# Patient Record
Sex: Male | Born: 1950
Health system: Southern US, Community
[De-identification: ages and names within clinical notes are randomized; demographics above are authoritative.]

## PROBLEM LIST (undated history)

## (undated) DIAGNOSIS — S52501A Unspecified fracture of the lower end of right radius, initial encounter for closed fracture: Secondary | ICD-10-CM

## (undated) DIAGNOSIS — E7800 Pure hypercholesterolemia, unspecified: Secondary | ICD-10-CM

## (undated) DIAGNOSIS — E119 Type 2 diabetes mellitus without complications: Secondary | ICD-10-CM

## (undated) DIAGNOSIS — I10 Essential (primary) hypertension: Secondary | ICD-10-CM

## (undated) DIAGNOSIS — K219 Gastro-esophageal reflux disease without esophagitis: Secondary | ICD-10-CM

## (undated) HISTORY — PX: HERNIA REPAIR: SHX51

## (undated) HISTORY — PX: KNEE SURGERY: SHX244

## (undated) HISTORY — PX: KNEE ARTHROSCOPY: SHX127

---

## 2012-06-03 ENCOUNTER — Other Ambulatory Visit: Payer: Self-pay | Admitting: Gastroenterology

## 2012-06-03 DIAGNOSIS — R1031 Right lower quadrant pain: Secondary | ICD-10-CM

## 2012-06-08 ENCOUNTER — Ambulatory Visit
Admission: RE | Admit: 2012-06-08 | Discharge: 2012-06-08 | Disposition: A | Discharge status: Home / Self Care | Payer: Federal, State, Local not specified - PPO | Source: Ambulatory Visit | Attending: Gastroenterology | Admitting: Gastroenterology

## 2012-06-08 DIAGNOSIS — R1031 Right lower quadrant pain: Secondary | ICD-10-CM

## 2012-06-08 MED ORDER — IOHEXOL 300 MG/ML  SOLN
100.0000 mL | Freq: Once | INTRAMUSCULAR | Status: AC | PRN
  Administered 2012-06-08: 100 mL via INTRAVENOUS

## 2014-02-05 DIAGNOSIS — E785 Hyperlipidemia, unspecified: Secondary | ICD-10-CM | POA: Diagnosis not present

## 2014-02-05 DIAGNOSIS — Z0000 Encounter for general adult medical examination without abnormal findings: Secondary | ICD-10-CM | POA: Diagnosis not present

## 2014-02-05 DIAGNOSIS — E119 Type 2 diabetes mellitus without complications: Secondary | ICD-10-CM | POA: Diagnosis not present

## 2014-02-05 DIAGNOSIS — K219 Gastro-esophageal reflux disease without esophagitis: Secondary | ICD-10-CM | POA: Diagnosis not present

## 2014-02-05 DIAGNOSIS — I10 Essential (primary) hypertension: Secondary | ICD-10-CM | POA: Diagnosis not present

## 2014-03-21 DIAGNOSIS — L570 Actinic keratosis: Secondary | ICD-10-CM | POA: Diagnosis not present

## 2014-03-21 DIAGNOSIS — L821 Other seborrheic keratosis: Secondary | ICD-10-CM | POA: Diagnosis not present

## 2014-06-14 DIAGNOSIS — Z23 Encounter for immunization: Secondary | ICD-10-CM | POA: Diagnosis not present

## 2014-09-21 DIAGNOSIS — L738 Other specified follicular disorders: Secondary | ICD-10-CM | POA: Diagnosis not present

## 2014-09-21 DIAGNOSIS — E785 Hyperlipidemia, unspecified: Secondary | ICD-10-CM | POA: Diagnosis not present

## 2014-09-21 DIAGNOSIS — L570 Actinic keratosis: Secondary | ICD-10-CM | POA: Diagnosis not present

## 2014-09-21 DIAGNOSIS — Z23 Encounter for immunization: Secondary | ICD-10-CM | POA: Diagnosis not present

## 2014-09-21 DIAGNOSIS — L821 Other seborrheic keratosis: Secondary | ICD-10-CM | POA: Diagnosis not present

## 2014-09-21 DIAGNOSIS — K219 Gastro-esophageal reflux disease without esophagitis: Secondary | ICD-10-CM | POA: Diagnosis not present

## 2014-09-21 DIAGNOSIS — I10 Essential (primary) hypertension: Secondary | ICD-10-CM | POA: Diagnosis not present

## 2014-09-21 DIAGNOSIS — Z86010 Personal history of colonic polyps: Secondary | ICD-10-CM | POA: Diagnosis not present

## 2014-09-21 DIAGNOSIS — E119 Type 2 diabetes mellitus without complications: Secondary | ICD-10-CM | POA: Diagnosis not present

## 2014-09-21 DIAGNOSIS — F419 Anxiety disorder, unspecified: Secondary | ICD-10-CM | POA: Diagnosis not present

## 2014-11-12 DIAGNOSIS — E119 Type 2 diabetes mellitus without complications: Secondary | ICD-10-CM | POA: Diagnosis not present

## 2015-04-03 DIAGNOSIS — Z0000 Encounter for general adult medical examination without abnormal findings: Secondary | ICD-10-CM | POA: Diagnosis not present

## 2015-04-03 DIAGNOSIS — K219 Gastro-esophageal reflux disease without esophagitis: Secondary | ICD-10-CM | POA: Diagnosis not present

## 2015-04-03 DIAGNOSIS — E785 Hyperlipidemia, unspecified: Secondary | ICD-10-CM | POA: Diagnosis not present

## 2015-04-03 DIAGNOSIS — E119 Type 2 diabetes mellitus without complications: Secondary | ICD-10-CM | POA: Diagnosis not present

## 2015-04-03 DIAGNOSIS — Z23 Encounter for immunization: Secondary | ICD-10-CM | POA: Diagnosis not present

## 2015-04-03 DIAGNOSIS — I10 Essential (primary) hypertension: Secondary | ICD-10-CM | POA: Diagnosis not present

## 2015-04-03 DIAGNOSIS — Z125 Encounter for screening for malignant neoplasm of prostate: Secondary | ICD-10-CM | POA: Diagnosis not present

## 2015-09-20 DIAGNOSIS — D1801 Hemangioma of skin and subcutaneous tissue: Secondary | ICD-10-CM | POA: Diagnosis not present

## 2015-09-20 DIAGNOSIS — L570 Actinic keratosis: Secondary | ICD-10-CM | POA: Diagnosis not present

## 2015-09-20 DIAGNOSIS — L814 Other melanin hyperpigmentation: Secondary | ICD-10-CM | POA: Diagnosis not present

## 2015-09-20 DIAGNOSIS — L821 Other seborrheic keratosis: Secondary | ICD-10-CM | POA: Diagnosis not present

## 2015-09-20 DIAGNOSIS — D225 Melanocytic nevi of trunk: Secondary | ICD-10-CM | POA: Diagnosis not present

## 2015-10-02 DIAGNOSIS — K219 Gastro-esophageal reflux disease without esophagitis: Secondary | ICD-10-CM | POA: Diagnosis not present

## 2015-10-02 DIAGNOSIS — I10 Essential (primary) hypertension: Secondary | ICD-10-CM | POA: Diagnosis not present

## 2015-10-02 DIAGNOSIS — E785 Hyperlipidemia, unspecified: Secondary | ICD-10-CM | POA: Diagnosis not present

## 2015-10-02 DIAGNOSIS — E119 Type 2 diabetes mellitus without complications: Secondary | ICD-10-CM | POA: Diagnosis not present

## 2015-10-02 DIAGNOSIS — Z7984 Long term (current) use of oral hypoglycemic drugs: Secondary | ICD-10-CM | POA: Diagnosis not present

## 2015-11-14 DIAGNOSIS — E119 Type 2 diabetes mellitus without complications: Secondary | ICD-10-CM | POA: Diagnosis not present

## 2015-11-14 DIAGNOSIS — Z0100 Encounter for examination of eyes and vision without abnormal findings: Secondary | ICD-10-CM | POA: Diagnosis not present

## 2015-11-14 DIAGNOSIS — H2513 Age-related nuclear cataract, bilateral: Secondary | ICD-10-CM | POA: Diagnosis not present

## 2016-01-17 DIAGNOSIS — E1142 Type 2 diabetes mellitus with diabetic polyneuropathy: Secondary | ICD-10-CM | POA: Diagnosis not present

## 2016-01-17 DIAGNOSIS — Z7984 Long term (current) use of oral hypoglycemic drugs: Secondary | ICD-10-CM | POA: Diagnosis not present

## 2016-04-13 DIAGNOSIS — Z23 Encounter for immunization: Secondary | ICD-10-CM | POA: Diagnosis not present

## 2016-04-13 DIAGNOSIS — E1142 Type 2 diabetes mellitus with diabetic polyneuropathy: Secondary | ICD-10-CM | POA: Diagnosis not present

## 2016-04-13 DIAGNOSIS — E785 Hyperlipidemia, unspecified: Secondary | ICD-10-CM | POA: Diagnosis not present

## 2016-04-13 DIAGNOSIS — Z125 Encounter for screening for malignant neoplasm of prostate: Secondary | ICD-10-CM | POA: Diagnosis not present

## 2016-04-13 DIAGNOSIS — I10 Essential (primary) hypertension: Secondary | ICD-10-CM | POA: Diagnosis not present

## 2016-04-13 DIAGNOSIS — K219 Gastro-esophageal reflux disease without esophagitis: Secondary | ICD-10-CM | POA: Diagnosis not present

## 2016-04-13 DIAGNOSIS — Z0000 Encounter for general adult medical examination without abnormal findings: Secondary | ICD-10-CM | POA: Diagnosis not present

## 2016-09-23 DIAGNOSIS — D1800 Hemangioma unspecified site: Secondary | ICD-10-CM | POA: Diagnosis not present

## 2016-09-23 DIAGNOSIS — L821 Other seborrheic keratosis: Secondary | ICD-10-CM | POA: Diagnosis not present

## 2016-09-23 DIAGNOSIS — Z23 Encounter for immunization: Secondary | ICD-10-CM | POA: Diagnosis not present

## 2016-09-23 DIAGNOSIS — L814 Other melanin hyperpigmentation: Secondary | ICD-10-CM | POA: Diagnosis not present

## 2016-09-23 DIAGNOSIS — D225 Melanocytic nevi of trunk: Secondary | ICD-10-CM | POA: Diagnosis not present

## 2016-10-15 DIAGNOSIS — E7800 Pure hypercholesterolemia, unspecified: Secondary | ICD-10-CM | POA: Diagnosis not present

## 2016-10-15 DIAGNOSIS — Z7984 Long term (current) use of oral hypoglycemic drugs: Secondary | ICD-10-CM | POA: Diagnosis not present

## 2016-10-15 DIAGNOSIS — E1142 Type 2 diabetes mellitus with diabetic polyneuropathy: Secondary | ICD-10-CM | POA: Diagnosis not present

## 2016-10-15 DIAGNOSIS — I10 Essential (primary) hypertension: Secondary | ICD-10-CM | POA: Diagnosis not present

## 2016-10-15 DIAGNOSIS — K219 Gastro-esophageal reflux disease without esophagitis: Secondary | ICD-10-CM | POA: Diagnosis not present

## 2016-11-13 DIAGNOSIS — H2513 Age-related nuclear cataract, bilateral: Secondary | ICD-10-CM | POA: Diagnosis not present

## 2016-11-13 DIAGNOSIS — H5203 Hypermetropia, bilateral: Secondary | ICD-10-CM | POA: Diagnosis not present

## 2016-11-13 DIAGNOSIS — E119 Type 2 diabetes mellitus without complications: Secondary | ICD-10-CM | POA: Diagnosis not present

## 2017-04-23 DIAGNOSIS — Z1389 Encounter for screening for other disorder: Secondary | ICD-10-CM | POA: Diagnosis not present

## 2017-04-23 DIAGNOSIS — I10 Essential (primary) hypertension: Secondary | ICD-10-CM | POA: Diagnosis not present

## 2017-04-23 DIAGNOSIS — Z125 Encounter for screening for malignant neoplasm of prostate: Secondary | ICD-10-CM | POA: Diagnosis not present

## 2017-04-23 DIAGNOSIS — Z23 Encounter for immunization: Secondary | ICD-10-CM | POA: Diagnosis not present

## 2017-04-23 DIAGNOSIS — E1142 Type 2 diabetes mellitus with diabetic polyneuropathy: Secondary | ICD-10-CM | POA: Diagnosis not present

## 2017-04-23 DIAGNOSIS — E7800 Pure hypercholesterolemia, unspecified: Secondary | ICD-10-CM | POA: Diagnosis not present

## 2017-04-23 DIAGNOSIS — K219 Gastro-esophageal reflux disease without esophagitis: Secondary | ICD-10-CM | POA: Diagnosis not present

## 2017-07-15 ENCOUNTER — Emergency Department (HOSPITAL_COMMUNITY)
Admission: EM | Admit: 2017-07-15 | Discharge: 2017-07-15 | Disposition: A | Discharge status: Home / Self Care | Payer: Medicare Other | Attending: Emergency Medicine | Admitting: Emergency Medicine

## 2017-07-15 ENCOUNTER — Encounter (HOSPITAL_COMMUNITY): Payer: Self-pay | Admitting: Emergency Medicine

## 2017-07-15 ENCOUNTER — Emergency Department (HOSPITAL_COMMUNITY): Payer: Medicare Other

## 2017-07-15 DIAGNOSIS — Y999 Unspecified external cause status: Secondary | ICD-10-CM | POA: Insufficient documentation

## 2017-07-15 DIAGNOSIS — W000XXA Fall on same level due to ice and snow, initial encounter: Secondary | ICD-10-CM | POA: Insufficient documentation

## 2017-07-15 DIAGNOSIS — E119 Type 2 diabetes mellitus without complications: Secondary | ICD-10-CM | POA: Insufficient documentation

## 2017-07-15 DIAGNOSIS — S52571A Other intraarticular fracture of lower end of right radius, initial encounter for closed fracture: Secondary | ICD-10-CM | POA: Diagnosis not present

## 2017-07-15 DIAGNOSIS — Z87891 Personal history of nicotine dependence: Secondary | ICD-10-CM | POA: Insufficient documentation

## 2017-07-15 DIAGNOSIS — Y929 Unspecified place or not applicable: Secondary | ICD-10-CM | POA: Diagnosis not present

## 2017-07-15 DIAGNOSIS — S52501A Unspecified fracture of the lower end of right radius, initial encounter for closed fracture: Secondary | ICD-10-CM

## 2017-07-15 DIAGNOSIS — Z791 Long term (current) use of non-steroidal anti-inflammatories (NSAID): Secondary | ICD-10-CM | POA: Diagnosis not present

## 2017-07-15 DIAGNOSIS — S59911A Unspecified injury of right forearm, initial encounter: Secondary | ICD-10-CM | POA: Diagnosis present

## 2017-07-15 DIAGNOSIS — I10 Essential (primary) hypertension: Secondary | ICD-10-CM | POA: Diagnosis not present

## 2017-07-15 DIAGNOSIS — Y9389 Activity, other specified: Secondary | ICD-10-CM | POA: Diagnosis not present

## 2017-07-15 HISTORY — DX: Pure hypercholesterolemia, unspecified: E78.00

## 2017-07-15 HISTORY — DX: Type 2 diabetes mellitus without complications: E11.9

## 2017-07-15 HISTORY — DX: Gastro-esophageal reflux disease without esophagitis: K21.9

## 2017-07-15 HISTORY — DX: Essential (primary) hypertension: I10

## 2017-07-15 MED ORDER — HYDROCODONE-ACETAMINOPHEN 5-325 MG PO TABS: 1.0000 | ORAL_TABLET | Freq: Four times a day (QID) | ORAL | 0 refills | Status: DC | PRN

## 2017-07-15 MED ORDER — HYDROCODONE-ACETAMINOPHEN 5-325 MG PO TABS
1.0000 | ORAL_TABLET | Freq: Once | ORAL | Status: AC
  Administered 2017-07-15: 1 via ORAL
  Filled 2017-07-15: qty 1

## 2017-07-15 NOTE — ED Triage Notes (Signed)
Patient reports slipping on ice this morning causing him to fall. Patient c/o pain in right wrist area. Swelling is noted at this time.

## 2017-07-15 NOTE — ED Provider Notes (Signed)
[facility] DEPT Provider Note   CSN: [id number] Arrival date & time: 07/15/17  0849     History   Chief Complaint Chief Complaint  Patient presents with  . Fall  . Wrist Pain    HPI Bradley Larsen is a 66 y.o. male w PMHx HTN, DM, presenting s/p mechanical fall that occurred prior to arrival. Pt slipped on the ice, catching himself on his outstretched right hand. Localizes pain to dorsal lateral aspect of wrist, worse with movement and palpation. Notes assoc swelling. Denies N/T. He is right hand dominant, not a smoker. Hx of right wrist fracture at age 16. No other injuries, denies head trauma, no neck or back pain. No wounds.  The history is provided by the patient.    Past Medical History:  Diagnosis Date  . Acid reflux   . Diabetes mellitus without complication ([facility])   . High cholesterol   . Hypertension     There are no active problems to display for this patient.   Past Surgical History:  Procedure Laterality Date  . HERNIA REPAIR         Home Medications    Prior to Admission medications   Medication Sig Start Date End Date Taking? Authorizing Provider  atorvastatin (LIPITOR) 40 MG tablet Take 40 mg by mouth daily. 07/06/17  Yes [provider]  gabapentin (NEURONTIN) 300 MG capsule Take 300 mg by mouth 3 (three) times daily. 07/06/17  Yes [provider]  ibuprofen (ADVIL,MOTRIN) 200 MG tablet Take 400-600 mg by mouth every 6 (six) hours as needed for headache, mild pain or moderate pain.   Yes [provider]  lansoprazole (PREVACID) 30 MG capsule Take 30 mg by mouth daily. 07/08/17  Yes [provider]  lisinopril-hydrochlorothiazide (PRINZIDE,ZESTORETIC) 20-25 MG tablet Take 1 tablet by mouth daily. 07/06/17  Yes [provider]  metFORMIN (GLUCOPHAGE) 500 MG tablet Take 500 mg by mouth 2 (two) times daily. 07/06/17  Yes [provider]  HYDROcodone-acetaminophen  (NORCO/VICODIN) 5-325 MG tablet Take 1-2 tablets by mouth every 6 (six) hours as needed for severe pain. 07/15/17   Eldredge Veldhuizen, [place] N, PA-C    Family History No family history on file.  Social History Social History   Tobacco Use  . Smoking status: Former Research scientist (life sciences)  . Smokeless tobacco: Never Used  Substance Use Topics  . Alcohol use: Yes  . Drug use: Not on file     Allergies   Patient has no known allergies.   Review of Systems Review of Systems  Musculoskeletal: Positive for arthralgias and joint swelling.  Skin: Negative for wound.  Neurological: Negative for numbness.  All other systems reviewed and are negative.    Physical Exam Updated Vital Signs BP (!) 152/86 (BP Location: Left Arm)   Pulse 64   Temp 97.9 F (36.6 C) (Oral)   Resp 16   Ht 6' (1.829 m)   Wt 90.7 kg (200 lb)   SpO2 100%   BMI 27.12 kg/m   Physical Exam  Constitutional: He appears well-developed and well-nourished. No distress.  HENT:  Head: Normocephalic and atraumatic.  Eyes: Conjunctivae are normal.  Cardiovascular: Normal rate and intact distal pulses.  Pulmonary/Chest: Effort normal.  Abdominal: Soft.  Musculoskeletal:  Right dorsal-lateral aspect of wrist with edema and tenderness.  Some tenderness to distal right forearm.  No tenderness over ulnar styloid process.  Elbow and shoulder without tenderness and with normal range of motion.  Fingers and hand with normal  range of motion and without tenderness.  Intact distal pulses and normal sensation.  No wounds.  Neurological: He is alert.  Skin: Skin is warm.  Psychiatric: He has a normal mood and affect. His behavior is normal.  Nursing note and vitals reviewed.    ED Treatments / Results  Labs (all labs ordered are listed, but only abnormal results are displayed) Labs Reviewed - No data to display  EKG  EKG Interpretation None       Radiology Dg Wrist Complete Right  Result Date: 07/15/2017 CLINICAL DATA:  Fall  today with wrist pain, initial encounter EXAM: RIGHT WRIST - COMPLETE 3+ VIEW COMPARISON:  None. FINDINGS: There is a comminuted fracture of the distal right radius with extension into the radiocarpal joint. Mild impaction and posterior angulation at the fracture site is noted. Degenerative changes in the second carpal row are noted. No other fractures are identified. IMPRESSION: Comminuted distal right radial fracture with intra-articular involvement. Electronically Signed   By: Inez Catalina M.D.   On: 07/15/2017 09:35    Procedures Procedures (including critical care time)  Medications Ordered in ED Medications  HYDROcodone-acetaminophen (NORCO/VICODIN) 5-325 MG per tablet 1 tablet (1 tablet Oral Given 07/15/17 0951)     Initial Impression / Assessment and Plan / ED Course  I have reviewed the triage vital signs and the nursing notes.  Pertinent labs & imaging results that were available during my care of the patient were reviewed by me and considered in my medical decision making (see chart for details).  Clinical Course as of Jul 15 1020  Thu Jul 15, 2017  1008 Joslyn Devon, RN with Dr. Fredna Dow, reporting Dr. Fredna Dow recommends splint and outpatient follow up.  [JR]    Clinical Course User Index [JR] Dulcey Riederer, [place] N, PA-C    Patient X-Ray with right comminuted fracture of distal radius with intra-articular involvement. NV intact. Pain managed in ED. Dr. Fredna Dow recommending splint and outpatient follow up. Arm placed in sugar tong splint in ED. Conservative therapy recommended and discussed.   Patient discussed with and seen by Dr. Lacinda Axon.  [place] Cont[place] Substance reporting System queried  Discussed results, findings, treatment and follow up. Patient advised of return precautions. Patient verbalized understanding and agreed with plan.  Final Clinical Impressions(s) / ED Diagnoses   Final diagnoses:  Nondisplaced fracture of distal end of right radius    ED Discharge Orders         Ordered    HYDROcodone-acetaminophen (NORCO/VICODIN) 5-325 MG tablet  Every 6 hours PRN     07/15/17 1021       Tekela Garguilo, [place] N, PA-C 07/15/17 1021    Tim Wilhide, [place] N, [place] 07/15/17 1021    Nat Christen, MD 07/16/17 1149

## 2017-07-15 NOTE — Discharge Instructions (Signed)
Please read instructions below. Keep your wrist elevated as much as possible. You can take hydrocodone every 6 hours as needed for severe pain. Do not drive, drink alcohol, or take tylenol with this medication. You can take advil/ibuprofen every 6 hours with this medication, or alone for moderate pain. Schedule an appointment with the orthopedic specialist in 1 week for repeat x-ray and follow-up on your injury. Return to the ER for new or concerning symptoms.

## 2017-07-19 ENCOUNTER — Other Ambulatory Visit: Payer: Self-pay | Admitting: Orthopedic Surgery

## 2017-07-19 DIAGNOSIS — S52571A Other intraarticular fracture of lower end of right radius, initial encounter for closed fracture: Secondary | ICD-10-CM | POA: Diagnosis not present

## 2017-07-20 ENCOUNTER — Encounter (HOSPITAL_BASED_OUTPATIENT_CLINIC_OR_DEPARTMENT_OTHER): Payer: Self-pay | Admitting: *Deleted

## 2017-07-20 ENCOUNTER — Other Ambulatory Visit: Payer: Self-pay

## 2017-07-20 ENCOUNTER — Encounter (HOSPITAL_BASED_OUTPATIENT_CLINIC_OR_DEPARTMENT_OTHER)
Admission: RE | Admit: 2017-07-20 | Discharge: 2017-07-20 | Disposition: A | Discharge status: Home / Self Care | Payer: Medicare Other | Source: Ambulatory Visit | Attending: Orthopedic Surgery | Admitting: Orthopedic Surgery

## 2017-07-20 DIAGNOSIS — K219 Gastro-esophageal reflux disease without esophagitis: Secondary | ICD-10-CM | POA: Diagnosis not present

## 2017-07-20 DIAGNOSIS — E119 Type 2 diabetes mellitus without complications: Secondary | ICD-10-CM | POA: Diagnosis not present

## 2017-07-20 DIAGNOSIS — Z7984 Long term (current) use of oral hypoglycemic drugs: Secondary | ICD-10-CM | POA: Diagnosis not present

## 2017-07-20 DIAGNOSIS — Z79899 Other long term (current) drug therapy: Secondary | ICD-10-CM | POA: Diagnosis not present

## 2017-07-20 DIAGNOSIS — S52571A Other intraarticular fracture of lower end of right radius, initial encounter for closed fracture: Secondary | ICD-10-CM | POA: Diagnosis not present

## 2017-07-20 DIAGNOSIS — I10 Essential (primary) hypertension: Secondary | ICD-10-CM | POA: Diagnosis not present

## 2017-07-20 DIAGNOSIS — W000XXA Fall on same level due to ice and snow, initial encounter: Secondary | ICD-10-CM | POA: Diagnosis not present

## 2017-07-20 DIAGNOSIS — Z87891 Personal history of nicotine dependence: Secondary | ICD-10-CM | POA: Diagnosis not present

## 2017-07-20 DIAGNOSIS — E7800 Pure hypercholesterolemia, unspecified: Secondary | ICD-10-CM | POA: Diagnosis not present

## 2017-07-20 LAB — BASIC METABOLIC PANEL
ANION GAP: 9 (ref 5–15)
BUN: 19 mg/dL (ref 6–20)
CALCIUM: 9.5 mg/dL (ref 8.9–10.3)
CO2: 25 mmol/L (ref 22–32)
CREATININE: 0.9 mg/dL (ref 0.61–1.24)
Chloride: 102 mmol/L (ref 101–111)
GFR calc non Af Amer: >60 mL/min (ref >60)
Glucose, Bld: 139 mg/dL — ABNORMAL HIGH (ref 65–99)
Potassium: 4.2 mmol/L (ref 3.5–5.1)
SODIUM: 136 mmol/L (ref 135–145)

## 2017-07-22 ENCOUNTER — Encounter (HOSPITAL_BASED_OUTPATIENT_CLINIC_OR_DEPARTMENT_OTHER): Payer: Self-pay | Admitting: *Deleted

## 2017-07-22 ENCOUNTER — Encounter (HOSPITAL_BASED_OUTPATIENT_CLINIC_OR_DEPARTMENT_OTHER)
Admission: RE | Disposition: A | Discharge status: Home / Self Care | Payer: Self-pay | Source: Ambulatory Visit | Attending: Orthopedic Surgery

## 2017-07-22 ENCOUNTER — Ambulatory Visit (HOSPITAL_BASED_OUTPATIENT_CLINIC_OR_DEPARTMENT_OTHER): Payer: Medicare Other | Admitting: Anesthesiology

## 2017-07-22 ENCOUNTER — Ambulatory Visit (HOSPITAL_BASED_OUTPATIENT_CLINIC_OR_DEPARTMENT_OTHER)
Admission: RE | Admit: 2017-07-22 | Discharge: 2017-07-22 | Disposition: A | Discharge status: Home / Self Care | Payer: Medicare Other | Source: Ambulatory Visit | Attending: Orthopedic Surgery | Admitting: Orthopedic Surgery

## 2017-07-22 ENCOUNTER — Other Ambulatory Visit: Payer: Self-pay

## 2017-07-22 DIAGNOSIS — Z87891 Personal history of nicotine dependence: Secondary | ICD-10-CM | POA: Diagnosis not present

## 2017-07-22 DIAGNOSIS — E119 Type 2 diabetes mellitus without complications: Secondary | ICD-10-CM | POA: Diagnosis not present

## 2017-07-22 DIAGNOSIS — Z79899 Other long term (current) drug therapy: Secondary | ICD-10-CM | POA: Insufficient documentation

## 2017-07-22 DIAGNOSIS — I10 Essential (primary) hypertension: Secondary | ICD-10-CM | POA: Diagnosis not present

## 2017-07-22 DIAGNOSIS — E7800 Pure hypercholesterolemia, unspecified: Secondary | ICD-10-CM | POA: Insufficient documentation

## 2017-07-22 DIAGNOSIS — G8918 Other acute postprocedural pain: Secondary | ICD-10-CM | POA: Diagnosis not present

## 2017-07-22 DIAGNOSIS — W000XXA Fall on same level due to ice and snow, initial encounter: Secondary | ICD-10-CM | POA: Insufficient documentation

## 2017-07-22 DIAGNOSIS — K219 Gastro-esophageal reflux disease without esophagitis: Secondary | ICD-10-CM | POA: Insufficient documentation

## 2017-07-22 DIAGNOSIS — Z7984 Long term (current) use of oral hypoglycemic drugs: Secondary | ICD-10-CM | POA: Insufficient documentation

## 2017-07-22 DIAGNOSIS — S52571A Other intraarticular fracture of lower end of right radius, initial encounter for closed fracture: Secondary | ICD-10-CM | POA: Diagnosis not present

## 2017-07-22 DIAGNOSIS — S52501A Unspecified fracture of the lower end of right radius, initial encounter for closed fracture: Secondary | ICD-10-CM | POA: Diagnosis not present

## 2017-07-22 HISTORY — PX: OPEN REDUCTION INTERNAL FIXATION (ORIF) DISTAL RADIAL FRACTURE: SHX5989

## 2017-07-22 HISTORY — DX: Unspecified fracture of the lower end of right radius, initial encounter for closed fracture: S52.501A

## 2017-07-22 LAB — GLUCOSE, CAPILLARY
GLUCOSE-CAPILLARY: 150 mg/dL — AB (ref 65–99)
Glucose-Capillary: 136 mg/dL — ABNORMAL HIGH (ref 65–99)

## 2017-07-22 SURGERY — OPEN REDUCTION INTERNAL FIXATION (ORIF) DISTAL RADIUS FRACTURE
Anesthesia: General | Site: Hand | Laterality: Right

## 2017-07-22 MED ORDER — ONDANSETRON HCL 4 MG/2ML IJ SOLN
INTRAMUSCULAR | Status: DC | PRN
  Administered 2017-07-22: 4 mg via INTRAVENOUS

## 2017-07-22 MED ORDER — HYDROMORPHONE HCL 1 MG/ML IJ SOLN
INTRAMUSCULAR | Status: AC
  Filled 2017-07-22: qty 0.5

## 2017-07-22 MED ORDER — LACTATED RINGERS IV SOLN
INTRAVENOUS | Status: DC
  Administered 2017-07-22 (×2): via INTRAVENOUS

## 2017-07-22 MED ORDER — FENTANYL CITRATE (PF) 100 MCG/2ML IJ SOLN
INTRAMUSCULAR | Status: AC
  Filled 2017-07-22: qty 2

## 2017-07-22 MED ORDER — HYDROMORPHONE HCL 1 MG/ML IJ SOLN
0.2500 mg | INTRAMUSCULAR | Status: DC | PRN
  Administered 2017-07-22 (×3): 0.5 mg via INTRAVENOUS

## 2017-07-22 MED ORDER — KETOROLAC TROMETHAMINE 30 MG/ML IJ SOLN
INTRAMUSCULAR | Status: AC
  Filled 2017-07-22: qty 1

## 2017-07-22 MED ORDER — PROPOFOL 10 MG/ML IV BOLUS
INTRAVENOUS | Status: DC | PRN
Start: 2017-07-22 — End: 2017-07-22
  Administered 2017-07-22: 150 mg via INTRAVENOUS

## 2017-07-22 MED ORDER — DEXAMETHASONE SODIUM PHOSPHATE 4 MG/ML IJ SOLN
INTRAMUSCULAR | Status: DC | PRN
  Administered 2017-07-22: 10 mg via INTRAVENOUS

## 2017-07-22 MED ORDER — OXYCODONE HCL 5 MG PO TABS
ORAL_TABLET | ORAL | Status: AC
  Filled 2017-07-22: qty 1

## 2017-07-22 MED ORDER — SCOPOLAMINE 1 MG/3DAYS TD PT72: 1.0000 | MEDICATED_PATCH | Freq: Once | TRANSDERMAL | Status: DC | PRN

## 2017-07-22 MED ORDER — FENTANYL CITRATE (PF) 100 MCG/2ML IJ SOLN
INTRAMUSCULAR | Status: DC | PRN
  Administered 2017-07-22: 25 ug via INTRAVENOUS

## 2017-07-22 MED ORDER — METHOCARBAMOL 1000 MG/10ML IJ SOLN
INTRAMUSCULAR | Status: AC
  Filled 2017-07-22: qty 10

## 2017-07-22 MED ORDER — METHOCARBAMOL 1000 MG/10ML IJ SOLN
500.0000 mg | Freq: Once | INTRAVENOUS | Status: AC
  Administered 2017-07-22: 500 mg via INTRAVENOUS

## 2017-07-22 MED ORDER — CEFAZOLIN SODIUM-DEXTROSE 2-4 GM/100ML-% IV SOLN
INTRAVENOUS | Status: AC
  Filled 2017-07-22: qty 100

## 2017-07-22 MED ORDER — MIDAZOLAM HCL 2 MG/2ML IJ SOLN
INTRAMUSCULAR | Status: AC
  Filled 2017-07-22: qty 2

## 2017-07-22 MED ORDER — OXYCODONE HCL 5 MG PO TABS
5.0000 mg | ORAL_TABLET | Freq: Once | ORAL | Status: AC
  Administered 2017-07-22: 5 mg via ORAL

## 2017-07-22 MED ORDER — PROMETHAZINE HCL 25 MG/ML IJ SOLN: 6.2500 mg | INTRAMUSCULAR | Status: DC | PRN

## 2017-07-22 MED ORDER — CEFAZOLIN SODIUM-DEXTROSE 2-4 GM/100ML-% IV SOLN
2.0000 g | INTRAVENOUS | Status: AC
  Administered 2017-07-22: 2 g via INTRAVENOUS

## 2017-07-22 MED ORDER — ROPIVACAINE HCL 7.5 MG/ML IJ SOLN
INTRAMUSCULAR | Status: DC | PRN
  Administered 2017-07-22: 25 mL via PERINEURAL

## 2017-07-22 MED ORDER — KETOROLAC TROMETHAMINE 30 MG/ML IJ SOLN
30.0000 mg | Freq: Once | INTRAMUSCULAR | Status: AC
  Administered 2017-07-22: 30 mg via INTRAVENOUS

## 2017-07-22 MED ORDER — CHLORHEXIDINE GLUCONATE 4 % EX LIQD: 60.0000 mL | Freq: Once | CUTANEOUS | Status: DC

## 2017-07-22 MED ORDER — LIDOCAINE HCL (CARDIAC) 20 MG/ML IV SOLN
INTRAVENOUS | Status: DC | PRN
  Administered 2017-07-22: 50 mg via INTRAVENOUS

## 2017-07-22 MED ORDER — FENTANYL CITRATE (PF) 100 MCG/2ML IJ SOLN
50.0000 ug | INTRAMUSCULAR | Status: DC | PRN
  Administered 2017-07-22: 100 ug via INTRAVENOUS

## 2017-07-22 MED ORDER — MIDAZOLAM HCL 2 MG/2ML IJ SOLN
1.0000 mg | INTRAMUSCULAR | Status: DC | PRN
  Administered 2017-07-22: 2 mg via INTRAVENOUS

## 2017-07-22 MED ORDER — OXYCODONE-ACETAMINOPHEN 5-325 MG PO TABS: ORAL_TABLET | ORAL | 0 refills | Status: AC

## 2017-07-22 SURGICAL SUPPLY — 61 items
BANDAGE ACE 3X5.8 VEL STRL LF (GAUZE/BANDAGES/DRESSINGS) ×3 IMPLANT
BIT DRILL 2.0 LNG QUCK RELEASE (BIT) ×1 IMPLANT
BIT DRILL 2.8X5 QR DISP (BIT) ×3 IMPLANT
BLADE SURG 15 STRL LF DISP TIS (BLADE) ×2 IMPLANT
BLADE SURG 15 STRL SS (BLADE) ×4
BNDG ESMARK 4X9 LF (GAUZE/BANDAGES/DRESSINGS) ×3 IMPLANT
BNDG GAUZE ELAST 4 BULKY (GAUZE/BANDAGES/DRESSINGS) ×3 IMPLANT
BNDG PLASTER X FAST 3X3 WHT LF (CAST SUPPLIES) ×30 IMPLANT
CHLORAPREP W/TINT 26ML (MISCELLANEOUS) ×3 IMPLANT
CORD BIPOLAR FORCEPS 12FT (ELECTRODE) ×3 IMPLANT
COVER BACK TABLE 60X90IN (DRAPES) ×3 IMPLANT
COVER MAYO STAND STRL (DRAPES) ×3 IMPLANT
CUFF TOURNIQUET SINGLE 18IN (TOURNIQUET CUFF) ×3 IMPLANT
CUFF TOURNIQUET SINGLE 24IN (TOURNIQUET CUFF) IMPLANT
DRAPE EXTREMITY T 121X128X90 (DRAPE) ×3 IMPLANT
DRAPE OEC MINIVIEW 54X84 (DRAPES) ×3 IMPLANT
DRAPE SURG 17X23 STRL (DRAPES) ×3 IMPLANT
DRILL 2.0 LNG QUICK RELEASE (BIT) ×3
GAUZE SPONGE 4X4 12PLY STRL (GAUZE/BANDAGES/DRESSINGS) ×3 IMPLANT
GAUZE XEROFORM 1X8 LF (GAUZE/BANDAGES/DRESSINGS) ×3 IMPLANT
GLOVE BIO SURGEON STRL SZ 6.5 (GLOVE) ×2 IMPLANT
GLOVE BIO SURGEON STRL SZ7.5 (GLOVE) ×3 IMPLANT
GLOVE BIO SURGEONS STRL SZ 6.5 (GLOVE) ×1
GLOVE BIOGEL PI IND STRL 8 (GLOVE) ×1 IMPLANT
GLOVE BIOGEL PI IND STRL 8.5 (GLOVE) ×1 IMPLANT
GLOVE BIOGEL PI INDICATOR 8 (GLOVE) ×2
GLOVE BIOGEL PI INDICATOR 8.5 (GLOVE) ×2
GLOVE EXAM NITRILE MD LF STRL (GLOVE) ×3 IMPLANT
GLOVE SURG ORTHO 8.0 STRL STRW (GLOVE) ×3 IMPLANT
GOWN STRL REUS W/ TWL LRG LVL3 (GOWN DISPOSABLE) ×1 IMPLANT
GOWN STRL REUS W/TWL LRG LVL3 (GOWN DISPOSABLE) ×2
GOWN STRL REUS W/TWL XL LVL3 (GOWN DISPOSABLE) ×6 IMPLANT
GUIDEWIRE ORTHO 0.054X6 (WIRE) ×9 IMPLANT
NEEDLE HYPO 25X1 1.5 SAFETY (NEEDLE) ×3 IMPLANT
NS IRRIG 1000ML POUR BTL (IV SOLUTION) ×3 IMPLANT
PACK BASIN DAY SURGERY FS (CUSTOM PROCEDURE TRAY) ×3 IMPLANT
PAD CAST 3X4 CTTN HI CHSV (CAST SUPPLIES) ×1 IMPLANT
PADDING CAST COTTON 3X4 STRL (CAST SUPPLIES) ×2
PLATE PROXIMAL ACULOC 2 WD RT (Plate) ×3 IMPLANT
SCREW CORT FT 20X2.3XLCK HEX (Screw) ×2 IMPLANT
SCREW CORTICAL LOCKING 2.3X20M (Screw) ×8 IMPLANT
SCREW CORTICAL LOCKING 2.3X22M (Screw) ×4 IMPLANT
SCREW CORTICAL LOCKING 2.3X24M (Screw) ×2 IMPLANT
SCREW FX20X2.3XSMTH LCK NS CRT (Screw) ×2 IMPLANT
SCREW FX22X2.3XLCK SMTH NS CRT (Screw) ×2 IMPLANT
SCREW FX24X2.3XLCK SMTH NS CRT (Screw) ×1 IMPLANT
SCREW HEX 3.5X15 NLCKG STRL (Screw) ×1 IMPLANT
SCREW HEX 3.5X15MM (Screw) ×3 IMPLANT
SCREW HEXALOBE NON-LOCK 3.5X16 (Screw) ×3 IMPLANT
SCREW NLCKG 13 3.5X13 HEXA (Screw) ×2 IMPLANT
SCREW NON-LOCK 3.5X13 (Screw) ×6 IMPLANT
SLEEVE SCD COMPRESS KNEE MED (MISCELLANEOUS) ×3 IMPLANT
SLING ARM FOAM STRAP LRG (SOFTGOODS) ×3 IMPLANT
STOCKINETTE 4X48 STRL (DRAPES) ×3 IMPLANT
SUT ETHILON 4 0 PS 2 18 (SUTURE) ×3 IMPLANT
SUT VICRYL 4-0 PS2 18IN ABS (SUTURE) ×3 IMPLANT
SYR BULB 3OZ (MISCELLANEOUS) ×3 IMPLANT
SYR CONTROL 10ML LL (SYRINGE) ×3 IMPLANT
TOWEL OR 17X24 6PK STRL BLUE (TOWEL DISPOSABLE) ×6 IMPLANT
TOWEL OR NON WOVEN STRL DISP B (DISPOSABLE) IMPLANT
UNDERPAD 30X30 (UNDERPADS AND DIAPERS) ×3 IMPLANT

## 2017-07-22 NOTE — Anesthesia Preprocedure Evaluation (Signed)
Anesthesia Evaluation  Patient identified by MRN, date of birth, ID band Patient awake    Reviewed: Allergy & Precautions, NPO status , Patient's Chart, lab work & pertinent test results  Airway Mallampati: II  TM Distance: >3 FB Neck ROM: Full    Dental no notable dental hx.    Pulmonary neg pulmonary ROS, former smoker,    Pulmonary exam normal breath sounds clear to auscultation       Cardiovascular hypertension, Pt. on medications negative cardio ROS Normal cardiovascular exam Rhythm:Regular Rate:Normal     Neuro/Psych negative neurological ROS  negative psych ROS   GI/Hepatic negative GI ROS, Neg liver ROS, GERD  Medicated,  Endo/Other  negative endocrine ROSdiabetes, Oral Hypoglycemic Agents  Renal/GU negative Renal ROS  negative genitourinary   Musculoskeletal negative musculoskeletal ROS (+)   Abdominal   Peds negative pediatric ROS (+)  Hematology negative hematology ROS (+)   Anesthesia Other Findings   Reproductive/Obstetrics negative OB ROS                             Anesthesia Physical Anesthesia Plan  ASA: II  Anesthesia Plan: General   Post-op Pain Management: GA combined w/ Regional for post-op pain   Induction: Intravenous  PONV Risk Score and Plan: 2 and Dexamethasone, Treatment may vary due to age or medical condition, Ondansetron and Midazolam  Airway Management Planned: LMA  Additional Equipment:   Intra-op Plan:   Post-operative Plan:   Informed Consent: I have reviewed the patients History and Physical, chart, labs and discussed the procedure including the risks, benefits and alternatives for the proposed anesthesia with the patient or authorized representative who has indicated his/her understanding and acceptance.     Plan Discussed with: CRNA, Surgeon and Anesthesiologist  Anesthesia Plan Comments: ( )        Anesthesia Quick  Evaluation

## 2017-07-22 NOTE — Op Note (Signed)
I assisted Surgeon(s) and Role:    * Leanora Cover, MD - Primary    Daryll Brod, MD - Assisting on the Procedure(s): OPEN REDUCTION INTERNAL FIXATION (ORIF) RIGHT DISTAL RADIAL FRACTURE on 07/22/2017.  I provided assistance on this case as follows: setup, approach, isolation of the fracture, reduction , stabilization, fixation and closure of the incision,application of the dressings and splint. I was present for the entire case.  Electronically signed by: Wynonia Sours, MD Date: 07/22/2017 Time: 5:55 PM

## 2017-07-22 NOTE — H&P (Signed)
  Bradley Larsen is an 66 y.o. male.   Chief Complaint: right distal radius fracture HPI: 66 yo rhd male states he slipped on ice last week sustaining injury to right wrist.  Seen in ED where XR revealed distal radius fracture.  Splinted and followed up in office.  He wishes to have operative fixation of the fracture.  Allergies: No Known Allergies  Past Medical History:  Diagnosis Date  . Acid reflux   . Diabetes mellitus without complication ([place])   . Distal radius fracture, right   . High cholesterol   . Hypertension     Past Surgical History:  Procedure Laterality Date  . HERNIA REPAIR    . KNEE ARTHROSCOPY Left     Family History: History reviewed. No pertinent family history.  Social History:   reports that he has quit smoking. he has never used smokeless tobacco. He reports that he drinks alcohol. He reports that he does not use drugs.  Medications: Medications Prior to Admission  Medication Sig Dispense Refill  . atorvastatin (LIPITOR) 40 MG tablet Take 40 mg by mouth daily.    Marland Kitchen gabapentin (NEURONTIN) 300 MG capsule Take 300 mg by mouth 3 (three) times daily.    Marland Kitchen HYDROcodone-acetaminophen (NORCO/VICODIN) 5-325 MG tablet Take 1-2 tablets by mouth every 6 (six) hours as needed for severe pain. 10 tablet 0  . ibuprofen (ADVIL,MOTRIN) 200 MG tablet Take 400-600 mg by mouth every 6 (six) hours as needed for headache, mild pain or moderate pain.    Marland Kitchen lansoprazole (PREVACID) 30 MG capsule Take 30 mg by mouth daily.    Marland Kitchen lisinopril-hydrochlorothiazide (PRINZIDE,ZESTORETIC) 20-25 MG tablet Take 1 tablet by mouth daily.    . metFORMIN (GLUCOPHAGE) 500 MG tablet Take 500 mg by mouth 2 (two) times daily.      Results for orders placed or performed during the hospital encounter of 07/22/17 (from the past 48 hour(s))  Glucose, capillary     Status: Abnormal   Collection Time: 07/22/17  1:31 PM  Result Value Ref Range   Glucose-Capillary 136 (H) 65 - 99 mg/dL    No results  found.   A comprehensive review of systems was negative.  Blood pressure 132/77, pulse 83, temperature 98 F (36.7 C), temperature source Oral, resp. rate 17, height 6' (1.829 m), weight 91.9 kg (202 lb 9.6 oz), SpO2 95 %.  General appearance: alert, cooperative and appears stated age Head: Normocephalic, without obvious abnormality, atraumatic Neck: supple, symmetrical, trachea midline Resp: clear to auscultation bilaterally Cardio: regular rate and rhythm GI: abdomen soft Extremities: Intact sensation and capillary refill all digits.  +epl/fpl/io.  No wounds.  Pulses: 2+ and symmetric Skin: Skin color, texture, turgor normal. No rashes or lesions Neurologic: Grossly normal Incision/Wound:none  Assessment/Plan Right distal radius fracture.  Non operative and operative treatment options were discussed with the patient and patient wishes to proceed with operative treatment.  Risks, benefits, and alternatives of surgery were discussed and the patient agrees with the plan of care.    Bradley Larsen R 07/22/2017, 2:59 PM

## 2017-07-22 NOTE — Progress Notes (Signed)
Assisted Dr. Oddono with right, ultrasound guided, supraclavicular block. Side rails up, monitors on throughout procedure. See vital signs in flow sheet. Tolerated Procedure well. 

## 2017-07-22 NOTE — Discharge Instructions (Addendum)

## 2017-07-22 NOTE — Transfer of Care (Signed)
Immediate Anesthesia Transfer of Care Note  Patient: Bradley Larsen  Procedure(s) Performed: OPEN REDUCTION INTERNAL FIXATION (ORIF) RIGHT DISTAL RADIAL FRACTURE (Right Hand)  Patient Location: PACU  Anesthesia Type:GA combined with regional for post-op pain  Level of Consciousness: sedated  Airway & Oxygen Therapy: Patient Spontanous Breathing and Patient connected to face mask oxygen  Post-op Assessment: Report given to RN and Post -op Vital signs reviewed and stable  Post vital signs: Reviewed and stable  Last Vitals:  Vitals:   07/22/17 1445 07/22/17 1500  BP: 128/71 128/66  Pulse: 83 84  Resp: 17 19  Temp:    SpO2: 95% 95%    Last Pain:  Vitals:   07/22/17 1500  TempSrc:   PainSc: 0-No pain      Patients Stated Pain Goal: 1 (88/87/57 9728)  Complications: No apparent anesthesia complications

## 2017-07-22 NOTE — Op Note (Signed)
07/22/2017 [facility]  Operative Note  Pre Op Diagnosis: Right comminuted intraarticular distal radius fracture  Post Op Diagnosis: Right comminuted intraarticular distal radius fracture  Procedure: ORIF Right comminuted intraarticular distal radius fracture, 3 intraarticular fragments  Surgeon: Leanora Cover, MD  Assistant: Daryll Brod, MD  Anesthesia: General and Regional  Fluids: Per anesthesia flow sheet  EBL: minimal  Complications: None  Specimen: None  Tourniquet Time:  Total Tourniquet Time Documented: Upper Arm (Right) - 44 minutes Total: Upper Arm (Right) - 44 minutes   Disposition: Stable to PACU  INDICATIONS:  Bradley Larsen is a 66 y.o. male who fell last week injuring his right wrist.  Seen in ED where XR revealed distal radius fracture.  He was splinted and followed up in the office.  We discussed nonoperative and operative treatment options.  He wished to proceed with operative fixation.  Risks, benefits, and alternatives of surgery were discussed including the risk of blood loss; infection; damage to nerves, vessels, tendons, ligaments, bone; failure of surgery; need for additional surgery; complications with wound healing; continued pain; nonunion; malunion; stiffness.  We also discussed the possible need for bone graft and the benefits and risks including the possibility of disease transmission.  He voiced understanding of these risks and elected to proceed.   OPERATIVE COURSE:  After being identified preoperatively by myself, the patient and I agreed upon the procedure and site of procedure.  Surgical site was marked.  The risks, benefits and alternatives of the surgery were reviewed and he wished to proceed.  Surgical consent had been signed.  He was given IV Ancef as preoperative antibiotic prophylaxis.  He was transferred to the operating room and placed on the operating room table in supine position with the Right upper extremity on an  armboard. General and Regional anesthesia was induced by the anesthesiologist.  The Right upper extremity was prepped and draped in normal sterile orthopedic fashion.  A surgical pause was performed between the surgeons, anesthesia and operating room staff, and all were in agreement as to the patient, procedure and site of procedure.  Tourniquet at the proximal aspect of the extremity was inflated to 250 mmHg after exsanguination of the limb with an Esmarch bandage.  Standard volar Mallie Mussel approach was used.  The bipolar electrocautery was used to obtain hemostasis.  The superficial and deep portions of the FCR tendon sheath were incised, and the FCR and FPL were swept ulnarly to protect the palmar cutaneous branch of the median nerve.  The brachioradialis was released at the radial side of the radius.  The pronator quadratus was released and elevated with the periosteal elevator.  The fracture site was identified and cleared of soft tissue interposition and hematoma.  It was reduced under direct visualization.  There was intraarticular extension creating three intraarticular fragments.   An AcuMed volar distal radial locking plate was selected.  It was secured to the bone with the guidepins.  C-arm was used in AP and lateral projections to ensure appropriate reduction and position of the hardware and adjustments made as necessary.  Standard AO drilling and measuring technique was used.  A single screw was placed in the slotted hole in the shaft of the plate.  The distal holes were filled with locking pegs with the exception of the styloid holes, which were filled with locking screws.  The remaining holes in the shaft of the plate were filled with nonlocking screws.  Good purchase was obtained.  C-arm was used in  AP, lateral and oblique projections to ensure appropriate reduction and position of hardware, which was the case.  There was no intra-articular penetration of hardware.  The wound was copiously irrigated with  sterile saline.  Pronator quadratus was repaired back over top of the plate using 4-0 Vicryl suture.  Vicryl suture was placed in the subcutaneous tissues in an inverted interrupted fashion and the skin was closed with 4-0 nylon in a horizontal mattress fashion.  There was good pronation and supination of the wrist without crepitance.  The wound was then dressed with sterile Xeroform, 4x4s, and wrapped with a Kerlix bandage.  A volar splint was placed and wrapped with Kerlix and Ace bandage.  Tourniquet was deflated at 44 minutes.  Fingertips were pink with brisk capillary refill after deflation of the tourniquet.  Operative drapes were broken down.  The patient was awoken from anesthesia safely.  He was transferred back to the stretcher and taken to the PACU in stable condition.  I will see him back in the office in one week for postoperative followup.  I will give him a prescription for percocet 5/325 1-2 tabs PO q6 hours prn pain, dispense #30.    Tennis Must, MD Electronically signed, 07/22/17

## 2017-07-22 NOTE — Brief Op Note (Signed)
07/22/2017  5:54 PM  PATIENT:  Murlean Caller  66 y.o. male  PRE-OPERATIVE DIAGNOSIS:  right distal radius fracture  S52.571A  POST-OPERATIVE DIAGNOSIS:  right distal radius fracture  PROCEDURE:  Procedure(s): OPEN REDUCTION INTERNAL FIXATION (ORIF) RIGHT DISTAL RADIAL FRACTURE (Right)  SURGEON:  Surgeon(s) and Role:    * Leanora Cover, MD - Primary    * Daryll Brod, MD - Assisting  PHYSICIAN ASSISTANT:   ASSISTANTS: Daryll Brod, MD   ANESTHESIA:   regional and general  EBL:  4 mL   BLOOD ADMINISTERED:none  DRAINS: none   LOCAL MEDICATIONS USED:  NONE  SPECIMEN:  No Specimen  DISPOSITION OF SPECIMEN:  N/A  COUNTS:  YES  TOURNIQUET:   Total Tourniquet Time Documented: Upper Arm (Right) - 44 minutes Total: Upper Arm (Right) - 44 minutes   DICTATION: .Note written in EPIC  PLAN OF CARE: Discharge to home after PACU  PATIENT DISPOSITION:  PACU - hemodynamically stable.

## 2017-07-22 NOTE — Anesthesia Procedure Notes (Signed)
Anesthesia Regional Block: Supraclavicular block   Pre-Anesthetic Checklist: ,, timeout performed, Correct Patient, Correct Site, Correct Laterality, Correct Procedure, Correct Position, site marked, Risks and benefits discussed,  Surgical consent,  Pre-op evaluation,  At surgeon's request and post-op pain management  Laterality: Right  Prep: chloraprep       Needles:  Injection technique: Single-shot  Needle Type: Echogenic Stimulator Needle     Needle Length: 5cm  Needle Gauge: 22     Additional Needles:   Procedures:,,,, ultrasound used (permanent image in chart),,,,  Narrative:  Start time: 07/22/2017 2:23 PM End time: 07/22/2017 2:32 PM Injection made incrementally with aspirations every 5 mL.  Performed by: Personally  Anesthesiologist: Janeece Riggers, MD  Additional Notes: Functioning IV was confirmed and monitors were applied.  A 75mm 22ga Arrow echogenic stimulator needle was used. Sterile prep and drape,hand hygiene and sterile gloves were used. Ultrasound guidance: relevant anatomy identified, needle position confirmed, local anesthetic spread visualized around nerve(s)., vascular puncture avoided.  Image printed for medical record. Negative aspiration and negative test dose prior to incremental administration of local anesthetic. The patient tolerated the procedure well.

## 2017-07-22 NOTE — Anesthesia Procedure Notes (Signed)
Procedure Name: LMA Insertion Performed by: Carmel Garfield W, CRNA Pre-anesthesia Checklist: Patient identified, Emergency Drugs available, Suction available and Patient being monitored Patient Re-evaluated:Patient Re-evaluated prior to induction Oxygen Delivery Method: Circle system utilized Preoxygenation: Pre-oxygenation with 100% oxygen Induction Type: IV induction Ventilation: Mask ventilation without difficulty LMA: LMA inserted LMA Size: 5.0 Number of attempts: 1 Placement Confirmation: positive ETCO2 Tube secured with: Tape Dental Injury: Teeth and Oropharynx as per pre-operative assessment        

## 2017-07-23 ENCOUNTER — Encounter (HOSPITAL_BASED_OUTPATIENT_CLINIC_OR_DEPARTMENT_OTHER): Payer: Self-pay | Admitting: Orthopedic Surgery

## 2017-07-23 NOTE — Anesthesia Postprocedure Evaluation (Signed)
Anesthesia Post Note  Patient: Bradley Larsen  Procedure(s) Performed: OPEN REDUCTION INTERNAL FIXATION (ORIF) RIGHT DISTAL RADIAL FRACTURE (Right Hand)     Patient location during evaluation: PACU Anesthesia Type: General Level of consciousness: awake and alert Pain management: pain level controlled Vital Signs Assessment: post-procedure vital signs reviewed and stable Respiratory status: spontaneous breathing, nonlabored ventilation, respiratory function stable and patient connected to nasal cannula oxygen Cardiovascular status: blood pressure returned to baseline and stable Postop Assessment: no apparent nausea or vomiting Anesthetic complications: no    Last Vitals:  Vitals:   07/22/17 1915 07/22/17 1945  BP: (!) 144/84 131/66  Pulse: 65 65  Resp: 13 16  Temp:  37 C  SpO2: 96% 95%    Last Pain:  Vitals:   07/22/17 1945  TempSrc:   PainSc: 5                  Tiajuana Amass

## 2017-07-28 DIAGNOSIS — S52571D Other intraarticular fracture of lower end of right radius, subsequent encounter for closed fracture with routine healing: Secondary | ICD-10-CM | POA: Diagnosis not present

## 2017-08-18 DIAGNOSIS — S52571D Other intraarticular fracture of lower end of right radius, subsequent encounter for closed fracture with routine healing: Secondary | ICD-10-CM | POA: Diagnosis not present

## 2017-09-17 DIAGNOSIS — S52571D Other intraarticular fracture of lower end of right radius, subsequent encounter for closed fracture with routine healing: Secondary | ICD-10-CM | POA: Diagnosis not present

## 2017-09-24 DIAGNOSIS — M25631 Stiffness of right wrist, not elsewhere classified: Secondary | ICD-10-CM | POA: Diagnosis not present

## 2017-09-24 DIAGNOSIS — M25531 Pain in right wrist: Secondary | ICD-10-CM | POA: Diagnosis not present

## 2017-09-24 DIAGNOSIS — S52571D Other intraarticular fracture of lower end of right radius, subsequent encounter for closed fracture with routine healing: Secondary | ICD-10-CM | POA: Diagnosis not present

## 2017-09-27 DIAGNOSIS — L219 Seborrheic dermatitis, unspecified: Secondary | ICD-10-CM | POA: Diagnosis not present

## 2017-09-27 DIAGNOSIS — D225 Melanocytic nevi of trunk: Secondary | ICD-10-CM | POA: Diagnosis not present

## 2017-09-27 DIAGNOSIS — Z23 Encounter for immunization: Secondary | ICD-10-CM | POA: Diagnosis not present

## 2017-09-27 DIAGNOSIS — L570 Actinic keratosis: Secondary | ICD-10-CM | POA: Diagnosis not present

## 2017-09-27 DIAGNOSIS — D1800 Hemangioma unspecified site: Secondary | ICD-10-CM | POA: Diagnosis not present

## 2017-09-27 DIAGNOSIS — L821 Other seborrheic keratosis: Secondary | ICD-10-CM | POA: Diagnosis not present

## 2017-09-27 DIAGNOSIS — L814 Other melanin hyperpigmentation: Secondary | ICD-10-CM | POA: Diagnosis not present

## 2017-10-01 DIAGNOSIS — M25531 Pain in right wrist: Secondary | ICD-10-CM | POA: Diagnosis not present

## 2017-10-01 DIAGNOSIS — S52571D Other intraarticular fracture of lower end of right radius, subsequent encounter for closed fracture with routine healing: Secondary | ICD-10-CM | POA: Diagnosis not present

## 2017-10-01 DIAGNOSIS — M25631 Stiffness of right wrist, not elsewhere classified: Secondary | ICD-10-CM | POA: Diagnosis not present

## 2017-10-08 DIAGNOSIS — S52571D Other intraarticular fracture of lower end of right radius, subsequent encounter for closed fracture with routine healing: Secondary | ICD-10-CM | POA: Diagnosis not present

## 2017-10-08 DIAGNOSIS — M25531 Pain in right wrist: Secondary | ICD-10-CM | POA: Diagnosis not present

## 2017-10-08 DIAGNOSIS — M25631 Stiffness of right wrist, not elsewhere classified: Secondary | ICD-10-CM | POA: Diagnosis not present

## 2017-10-13 DIAGNOSIS — M25531 Pain in right wrist: Secondary | ICD-10-CM | POA: Diagnosis not present

## 2017-10-13 DIAGNOSIS — R531 Weakness: Secondary | ICD-10-CM | POA: Diagnosis not present

## 2017-10-13 DIAGNOSIS — M25631 Stiffness of right wrist, not elsewhere classified: Secondary | ICD-10-CM | POA: Diagnosis not present

## 2017-10-20 DIAGNOSIS — S52571D Other intraarticular fracture of lower end of right radius, subsequent encounter for closed fracture with routine healing: Secondary | ICD-10-CM | POA: Diagnosis not present

## 2017-10-20 DIAGNOSIS — R531 Weakness: Secondary | ICD-10-CM | POA: Diagnosis not present

## 2017-10-20 DIAGNOSIS — M25531 Pain in right wrist: Secondary | ICD-10-CM | POA: Diagnosis not present

## 2017-10-20 DIAGNOSIS — M25631 Stiffness of right wrist, not elsewhere classified: Secondary | ICD-10-CM | POA: Diagnosis not present

## 2017-10-26 DIAGNOSIS — M25531 Pain in right wrist: Secondary | ICD-10-CM | POA: Diagnosis not present

## 2017-10-26 DIAGNOSIS — S52571D Other intraarticular fracture of lower end of right radius, subsequent encounter for closed fracture with routine healing: Secondary | ICD-10-CM | POA: Diagnosis not present

## 2017-10-26 DIAGNOSIS — R531 Weakness: Secondary | ICD-10-CM | POA: Diagnosis not present

## 2017-10-26 DIAGNOSIS — M25631 Stiffness of right wrist, not elsewhere classified: Secondary | ICD-10-CM | POA: Diagnosis not present

## 2017-10-27 DIAGNOSIS — E1142 Type 2 diabetes mellitus with diabetic polyneuropathy: Secondary | ICD-10-CM | POA: Diagnosis not present

## 2017-10-27 DIAGNOSIS — Z7984 Long term (current) use of oral hypoglycemic drugs: Secondary | ICD-10-CM | POA: Diagnosis not present

## 2017-10-27 DIAGNOSIS — I10 Essential (primary) hypertension: Secondary | ICD-10-CM | POA: Diagnosis not present

## 2017-10-27 DIAGNOSIS — E7800 Pure hypercholesterolemia, unspecified: Secondary | ICD-10-CM | POA: Diagnosis not present

## 2017-10-27 DIAGNOSIS — K219 Gastro-esophageal reflux disease without esophagitis: Secondary | ICD-10-CM | POA: Diagnosis not present

## 2017-10-29 DIAGNOSIS — S52571D Other intraarticular fracture of lower end of right radius, subsequent encounter for closed fracture with routine healing: Secondary | ICD-10-CM | POA: Diagnosis not present

## 2017-11-02 DIAGNOSIS — K635 Polyp of colon: Secondary | ICD-10-CM | POA: Diagnosis not present

## 2017-11-02 DIAGNOSIS — K5730 Diverticulosis of large intestine without perforation or abscess without bleeding: Secondary | ICD-10-CM | POA: Diagnosis not present

## 2017-11-02 DIAGNOSIS — K640 First degree hemorrhoids: Secondary | ICD-10-CM | POA: Diagnosis not present

## 2017-11-02 DIAGNOSIS — Z86010 Personal history of colonic polyps: Secondary | ICD-10-CM | POA: Diagnosis not present

## 2017-11-05 DIAGNOSIS — K635 Polyp of colon: Secondary | ICD-10-CM | POA: Diagnosis not present

## 2017-11-25 DIAGNOSIS — H5203 Hypermetropia, bilateral: Secondary | ICD-10-CM | POA: Diagnosis not present

## 2017-11-25 DIAGNOSIS — E119 Type 2 diabetes mellitus without complications: Secondary | ICD-10-CM | POA: Diagnosis not present

## 2017-11-25 DIAGNOSIS — H2513 Age-related nuclear cataract, bilateral: Secondary | ICD-10-CM | POA: Diagnosis not present

## 2018-05-05 DIAGNOSIS — Z0000 Encounter for general adult medical examination without abnormal findings: Secondary | ICD-10-CM | POA: Diagnosis not present

## 2018-05-05 DIAGNOSIS — I10 Essential (primary) hypertension: Secondary | ICD-10-CM | POA: Diagnosis not present

## 2018-05-05 DIAGNOSIS — E7800 Pure hypercholesterolemia, unspecified: Secondary | ICD-10-CM | POA: Diagnosis not present

## 2018-05-05 DIAGNOSIS — E1142 Type 2 diabetes mellitus with diabetic polyneuropathy: Secondary | ICD-10-CM | POA: Diagnosis not present

## 2018-05-05 DIAGNOSIS — Z125 Encounter for screening for malignant neoplasm of prostate: Secondary | ICD-10-CM | POA: Diagnosis not present

## 2018-05-05 DIAGNOSIS — K219 Gastro-esophageal reflux disease without esophagitis: Secondary | ICD-10-CM | POA: Diagnosis not present

## 2018-05-05 DIAGNOSIS — Z23 Encounter for immunization: Secondary | ICD-10-CM | POA: Diagnosis not present

## 2018-09-26 DIAGNOSIS — Z23 Encounter for immunization: Secondary | ICD-10-CM | POA: Diagnosis not present

## 2018-09-26 DIAGNOSIS — L570 Actinic keratosis: Secondary | ICD-10-CM | POA: Diagnosis not present

## 2018-09-26 DIAGNOSIS — L821 Other seborrheic keratosis: Secondary | ICD-10-CM | POA: Diagnosis not present

## 2018-09-26 DIAGNOSIS — L814 Other melanin hyperpigmentation: Secondary | ICD-10-CM | POA: Diagnosis not present

## 2018-09-26 DIAGNOSIS — D225 Melanocytic nevi of trunk: Secondary | ICD-10-CM | POA: Diagnosis not present

## 2018-11-16 DIAGNOSIS — I10 Essential (primary) hypertension: Secondary | ICD-10-CM | POA: Diagnosis not present

## 2018-11-16 DIAGNOSIS — K219 Gastro-esophageal reflux disease without esophagitis: Secondary | ICD-10-CM | POA: Diagnosis not present

## 2018-11-16 DIAGNOSIS — E7800 Pure hypercholesterolemia, unspecified: Secondary | ICD-10-CM | POA: Diagnosis not present

## 2018-11-16 DIAGNOSIS — E1142 Type 2 diabetes mellitus with diabetic polyneuropathy: Secondary | ICD-10-CM | POA: Diagnosis not present

## 2018-11-16 DIAGNOSIS — Z7984 Long term (current) use of oral hypoglycemic drugs: Secondary | ICD-10-CM | POA: Diagnosis not present

## 2019-05-04 DIAGNOSIS — H2513 Age-related nuclear cataract, bilateral: Secondary | ICD-10-CM | POA: Diagnosis not present

## 2019-05-04 DIAGNOSIS — E119 Type 2 diabetes mellitus without complications: Secondary | ICD-10-CM | POA: Diagnosis not present

## 2019-05-19 DIAGNOSIS — E1142 Type 2 diabetes mellitus with diabetic polyneuropathy: Secondary | ICD-10-CM | POA: Diagnosis not present

## 2019-05-19 DIAGNOSIS — Z0000 Encounter for general adult medical examination without abnormal findings: Secondary | ICD-10-CM | POA: Diagnosis not present

## 2019-05-19 DIAGNOSIS — E7800 Pure hypercholesterolemia, unspecified: Secondary | ICD-10-CM | POA: Diagnosis not present

## 2019-05-19 DIAGNOSIS — K219 Gastro-esophageal reflux disease without esophagitis: Secondary | ICD-10-CM | POA: Diagnosis not present

## 2019-05-19 DIAGNOSIS — I10 Essential (primary) hypertension: Secondary | ICD-10-CM | POA: Diagnosis not present

## 2019-06-05 DIAGNOSIS — Z23 Encounter for immunization: Secondary | ICD-10-CM | POA: Diagnosis not present

## 2019-06-05 DIAGNOSIS — Z125 Encounter for screening for malignant neoplasm of prostate: Secondary | ICD-10-CM | POA: Diagnosis not present

## 2019-06-05 DIAGNOSIS — E1142 Type 2 diabetes mellitus with diabetic polyneuropathy: Secondary | ICD-10-CM | POA: Diagnosis not present

## 2019-06-05 DIAGNOSIS — E7800 Pure hypercholesterolemia, unspecified: Secondary | ICD-10-CM | POA: Diagnosis not present

## 2019-06-05 DIAGNOSIS — I10 Essential (primary) hypertension: Secondary | ICD-10-CM | POA: Diagnosis not present

## 2019-06-05 DIAGNOSIS — Z7984 Long term (current) use of oral hypoglycemic drugs: Secondary | ICD-10-CM | POA: Diagnosis not present

## 2019-08-30 ENCOUNTER — Ambulatory Visit: Payer: Medicare Other

## 2019-09-08 ENCOUNTER — Ambulatory Visit: Payer: Medicare Other | Attending: Internal Medicine

## 2019-09-08 DIAGNOSIS — Z23 Encounter for immunization: Secondary | ICD-10-CM | POA: Insufficient documentation

## 2019-09-08 NOTE — Progress Notes (Signed)
   Covid-19 Vaccination Clinic  Name:  Bradley Larsen    MRN: [id number] DOB: Dec 26, 1950  09/08/2019  [name] was observed post Covid-19 immunization for 15 minutes without incidence. He was provided with Vaccine Information Sheet and instruction to access the V-Safe system.   [name] was instructed to call 911 with any severe reactions post vaccine: Marland Kitchen Difficulty breathing  . Swelling of your face and throat  . A fast heartbeat  . A bad rash all over your body  . Dizziness and weakness    Immunizations Administered    Name Date Dose VIS Date Route   Pfizer COVID-19 Vaccine 09/08/2019 12:58 PM 0.3 mL 07/14/2019 Intramuscular   Manufacturer: [place]   Lot: [id number]   [place]: [id number]

## 2019-09-20 ENCOUNTER — Ambulatory Visit: Payer: Medicare Other

## 2019-09-26 DIAGNOSIS — Z23 Encounter for immunization: Secondary | ICD-10-CM | POA: Diagnosis not present

## 2019-09-26 DIAGNOSIS — D225 Melanocytic nevi of trunk: Secondary | ICD-10-CM | POA: Diagnosis not present

## 2019-09-26 DIAGNOSIS — L578 Other skin changes due to chronic exposure to nonionizing radiation: Secondary | ICD-10-CM | POA: Diagnosis not present

## 2019-09-26 DIAGNOSIS — L814 Other melanin hyperpigmentation: Secondary | ICD-10-CM | POA: Diagnosis not present

## 2019-09-26 DIAGNOSIS — L570 Actinic keratosis: Secondary | ICD-10-CM | POA: Diagnosis not present

## 2019-09-26 DIAGNOSIS — L821 Other seborrheic keratosis: Secondary | ICD-10-CM | POA: Diagnosis not present

## 2019-10-03 ENCOUNTER — Ambulatory Visit: Payer: Medicare Other | Attending: Internal Medicine

## 2019-10-03 DIAGNOSIS — Z23 Encounter for immunization: Secondary | ICD-10-CM | POA: Insufficient documentation

## 2019-10-03 NOTE — Progress Notes (Signed)
   Covid-19 Vaccination Clinic  Name:  [name]    MRN: [id number] DOB: September 07, 1950  10/03/2019  [name] was observed post Covid-19 immunization for 15 minutes without incident. He was provided with Vaccine Information Sheet and instruction to access the V-Safe system.   [name] was instructed to call 911 with any severe reactions post vaccine: Marland Kitchen Difficulty breathing  . Swelling of face and throat  . A fast heartbeat  . A bad rash all over body  . Dizziness and weakness   Immunizations Administered    Name Date Dose VIS Date Route   Pfizer COVID-19 Vaccine 10/03/2019  1:12 PM 0.3 mL 07/14/2019 Intramuscular   Manufacturer: [place]   Lot: [id number]   [place]: [id number]

## 2019-11-17 DIAGNOSIS — E1142 Type 2 diabetes mellitus with diabetic polyneuropathy: Secondary | ICD-10-CM | POA: Diagnosis not present

## 2019-11-17 DIAGNOSIS — I10 Essential (primary) hypertension: Secondary | ICD-10-CM | POA: Diagnosis not present

## 2019-11-17 DIAGNOSIS — E7800 Pure hypercholesterolemia, unspecified: Secondary | ICD-10-CM | POA: Diagnosis not present

## 2019-11-17 DIAGNOSIS — K219 Gastro-esophageal reflux disease without esophagitis: Secondary | ICD-10-CM | POA: Diagnosis not present

## 2019-12-06 DIAGNOSIS — L821 Other seborrheic keratosis: Secondary | ICD-10-CM | POA: Diagnosis not present

## 2019-12-06 DIAGNOSIS — L309 Dermatitis, unspecified: Secondary | ICD-10-CM | POA: Diagnosis not present

## 2019-12-06 DIAGNOSIS — L820 Inflamed seborrheic keratosis: Secondary | ICD-10-CM | POA: Diagnosis not present

## 2020-01-19 DIAGNOSIS — E1142 Type 2 diabetes mellitus with diabetic polyneuropathy: Secondary | ICD-10-CM | POA: Diagnosis not present

## 2020-01-19 DIAGNOSIS — E119 Type 2 diabetes mellitus without complications: Secondary | ICD-10-CM | POA: Diagnosis not present

## 2020-01-19 DIAGNOSIS — I10 Essential (primary) hypertension: Secondary | ICD-10-CM | POA: Diagnosis not present

## 2020-01-19 DIAGNOSIS — E7800 Pure hypercholesterolemia, unspecified: Secondary | ICD-10-CM | POA: Diagnosis not present

## 2020-03-21 DIAGNOSIS — E1142 Type 2 diabetes mellitus with diabetic polyneuropathy: Secondary | ICD-10-CM | POA: Diagnosis not present

## 2020-03-21 DIAGNOSIS — I10 Essential (primary) hypertension: Secondary | ICD-10-CM | POA: Diagnosis not present

## 2020-03-21 DIAGNOSIS — Z7984 Long term (current) use of oral hypoglycemic drugs: Secondary | ICD-10-CM | POA: Diagnosis not present

## 2020-03-21 DIAGNOSIS — E119 Type 2 diabetes mellitus without complications: Secondary | ICD-10-CM | POA: Diagnosis not present

## 2020-03-21 DIAGNOSIS — E7800 Pure hypercholesterolemia, unspecified: Secondary | ICD-10-CM | POA: Diagnosis not present

## 2020-04-28 DIAGNOSIS — Z23 Encounter for immunization: Secondary | ICD-10-CM | POA: Diagnosis not present

## 2020-05-06 DIAGNOSIS — E119 Type 2 diabetes mellitus without complications: Secondary | ICD-10-CM | POA: Diagnosis not present

## 2020-05-06 DIAGNOSIS — H2513 Age-related nuclear cataract, bilateral: Secondary | ICD-10-CM | POA: Diagnosis not present

## 2020-05-06 DIAGNOSIS — H524 Presbyopia: Secondary | ICD-10-CM | POA: Diagnosis not present

## 2020-05-27 DIAGNOSIS — E1142 Type 2 diabetes mellitus with diabetic polyneuropathy: Secondary | ICD-10-CM | POA: Diagnosis not present

## 2020-05-27 DIAGNOSIS — I10 Essential (primary) hypertension: Secondary | ICD-10-CM | POA: Diagnosis not present

## 2020-05-27 DIAGNOSIS — E7800 Pure hypercholesterolemia, unspecified: Secondary | ICD-10-CM | POA: Diagnosis not present

## 2020-05-27 DIAGNOSIS — E119 Type 2 diabetes mellitus without complications: Secondary | ICD-10-CM | POA: Diagnosis not present

## 2020-06-04 DIAGNOSIS — Z0000 Encounter for general adult medical examination without abnormal findings: Secondary | ICD-10-CM | POA: Diagnosis not present

## 2020-06-04 DIAGNOSIS — E1142 Type 2 diabetes mellitus with diabetic polyneuropathy: Secondary | ICD-10-CM | POA: Diagnosis not present

## 2020-06-04 DIAGNOSIS — E7800 Pure hypercholesterolemia, unspecified: Secondary | ICD-10-CM | POA: Diagnosis not present

## 2020-06-04 DIAGNOSIS — I10 Essential (primary) hypertension: Secondary | ICD-10-CM | POA: Diagnosis not present

## 2020-06-04 DIAGNOSIS — K219 Gastro-esophageal reflux disease without esophagitis: Secondary | ICD-10-CM | POA: Diagnosis not present

## 2020-06-04 DIAGNOSIS — Z125 Encounter for screening for malignant neoplasm of prostate: Secondary | ICD-10-CM | POA: Diagnosis not present

## 2020-06-04 DIAGNOSIS — Z7984 Long term (current) use of oral hypoglycemic drugs: Secondary | ICD-10-CM | POA: Diagnosis not present

## 2020-10-02 DIAGNOSIS — L814 Other melanin hyperpigmentation: Secondary | ICD-10-CM | POA: Diagnosis not present

## 2020-10-02 DIAGNOSIS — L570 Actinic keratosis: Secondary | ICD-10-CM | POA: Diagnosis not present

## 2020-10-02 DIAGNOSIS — D225 Melanocytic nevi of trunk: Secondary | ICD-10-CM | POA: Diagnosis not present

## 2020-10-02 DIAGNOSIS — L821 Other seborrheic keratosis: Secondary | ICD-10-CM | POA: Diagnosis not present

## 2020-10-02 DIAGNOSIS — L578 Other skin changes due to chronic exposure to nonionizing radiation: Secondary | ICD-10-CM | POA: Diagnosis not present

## 2020-10-02 DIAGNOSIS — L309 Dermatitis, unspecified: Secondary | ICD-10-CM | POA: Diagnosis not present

## 2020-10-29 DIAGNOSIS — E1142 Type 2 diabetes mellitus with diabetic polyneuropathy: Secondary | ICD-10-CM | POA: Diagnosis not present

## 2020-10-29 DIAGNOSIS — E7800 Pure hypercholesterolemia, unspecified: Secondary | ICD-10-CM | POA: Diagnosis not present

## 2020-10-29 DIAGNOSIS — I10 Essential (primary) hypertension: Secondary | ICD-10-CM | POA: Diagnosis not present

## 2020-10-29 DIAGNOSIS — E119 Type 2 diabetes mellitus without complications: Secondary | ICD-10-CM | POA: Diagnosis not present

## 2020-10-29 DIAGNOSIS — K219 Gastro-esophageal reflux disease without esophagitis: Secondary | ICD-10-CM | POA: Diagnosis not present

## 2020-10-30 DIAGNOSIS — F419 Anxiety disorder, unspecified: Secondary | ICD-10-CM | POA: Diagnosis not present

## 2020-11-15 DIAGNOSIS — R5383 Other fatigue: Secondary | ICD-10-CM | POA: Diagnosis not present

## 2020-11-15 DIAGNOSIS — F419 Anxiety disorder, unspecified: Secondary | ICD-10-CM | POA: Diagnosis not present

## 2020-11-17 ENCOUNTER — Emergency Department (HOSPITAL_COMMUNITY): Payer: Medicare Other

## 2020-11-17 ENCOUNTER — Encounter (HOSPITAL_COMMUNITY): Payer: Self-pay

## 2020-11-17 ENCOUNTER — Emergency Department (HOSPITAL_COMMUNITY)
Admission: EM | Admit: 2020-11-17 | Discharge: 2020-11-17 | Disposition: A | Discharge status: Home / Self Care | Payer: Medicare Other | Attending: Emergency Medicine | Admitting: Emergency Medicine

## 2020-11-17 DIAGNOSIS — Z87891 Personal history of nicotine dependence: Secondary | ICD-10-CM | POA: Diagnosis not present

## 2020-11-17 DIAGNOSIS — F332 Major depressive disorder, recurrent severe without psychotic features: Secondary | ICD-10-CM | POA: Insufficient documentation

## 2020-11-17 DIAGNOSIS — E119 Type 2 diabetes mellitus without complications: Secondary | ICD-10-CM | POA: Diagnosis not present

## 2020-11-17 DIAGNOSIS — Z7984 Long term (current) use of oral hypoglycemic drugs: Secondary | ICD-10-CM | POA: Diagnosis not present

## 2020-11-17 DIAGNOSIS — F419 Anxiety disorder, unspecified: Secondary | ICD-10-CM | POA: Diagnosis present

## 2020-11-17 DIAGNOSIS — I10 Essential (primary) hypertension: Secondary | ICD-10-CM | POA: Insufficient documentation

## 2020-11-17 DIAGNOSIS — R4182 Altered mental status, unspecified: Secondary | ICD-10-CM | POA: Diagnosis not present

## 2020-11-17 DIAGNOSIS — R2681 Unsteadiness on feet: Secondary | ICD-10-CM | POA: Diagnosis not present

## 2020-11-17 DIAGNOSIS — E871 Hypo-osmolality and hyponatremia: Secondary | ICD-10-CM | POA: Diagnosis not present

## 2020-11-17 DIAGNOSIS — Z79899 Other long term (current) drug therapy: Secondary | ICD-10-CM | POA: Insufficient documentation

## 2020-11-17 DIAGNOSIS — F32A Depression, unspecified: Secondary | ICD-10-CM | POA: Diagnosis not present

## 2020-11-17 DIAGNOSIS — F1094 Alcohol use, unspecified with alcohol-induced mood disorder: Secondary | ICD-10-CM | POA: Insufficient documentation

## 2020-11-17 LAB — CBC WITH DIFFERENTIAL/PLATELET
Abs Immature Granulocytes: 0.01 10*3/uL (ref 0.00–0.07)
Basophils Absolute: 0 10*3/uL (ref 0.0–0.1)
Basophils Relative: 1 %
Eosinophils Absolute: 0.1 10*3/uL (ref 0.0–0.5)
Eosinophils Relative: 1 %
HCT: 40.1 % (ref 39.0–52.0)
Hemoglobin: 14.4 g/dL (ref 13.0–17.0)
Immature Granulocytes: 0 %
Lymphocytes Relative: 15 %
Lymphs Abs: 0.8 10*3/uL (ref 0.7–4.0)
MCH: 32.6 pg (ref 26.0–34.0)
MCHC: 35.9 g/dL (ref 30.0–36.0)
MCV: 90.7 fL (ref 80.0–100.0)
Monocytes Absolute: 0.7 10*3/uL (ref 0.1–1.0)
Monocytes Relative: 14 %
Neutro Abs: 3.6 10*3/uL (ref 1.7–7.7)
Neutrophils Relative %: 69 %
Platelets: 174 10*3/uL (ref 150–400)
RBC: 4.42 MIL/uL (ref 4.22–5.81)
RDW: 11.9 % (ref 11.5–15.5)
WBC: 5.2 10*3/uL (ref 4.0–10.5)
nRBC: 0 % (ref 0.0–0.2)

## 2020-11-17 LAB — COMPREHENSIVE METABOLIC PANEL
ALT: 23 U/L (ref 0–44)
AST: 22 U/L (ref 15–41)
Albumin: 3.9 g/dL (ref 3.5–5.0)
Alkaline Phosphatase: 48 U/L (ref 38–126)
Anion gap: 8 (ref 5–15)
BUN: 9 mg/dL (ref 8–23)
CO2: 24 mmol/L (ref 22–32)
Calcium: 9.2 mg/dL (ref 8.9–10.3)
Chloride: 94 mmol/L — ABNORMAL LOW (ref 98–111)
Creatinine, Ser: 0.8 mg/dL (ref 0.61–1.24)
GFR, Estimated: >60 mL/min (ref >60)
Glucose, Bld: 123 mg/dL — ABNORMAL HIGH (ref 70–99)
Potassium: 4 mmol/L (ref 3.5–5.1)
Sodium: 126 mmol/L — ABNORMAL LOW (ref 135–145)
Total Bilirubin: 0.6 mg/dL (ref 0.3–1.2)
Total Protein: 6.7 g/dL (ref 6.5–8.1)

## 2020-11-17 LAB — URINALYSIS, ROUTINE W REFLEX MICROSCOPIC
Bilirubin Urine: NEGATIVE
Glucose, UA: NEGATIVE mg/dL
Hgb urine dipstick: NEGATIVE
Ketones, ur: NEGATIVE mg/dL
Leukocytes,Ua: NEGATIVE
Nitrite: NEGATIVE
Protein, ur: NEGATIVE mg/dL
Specific Gravity, Urine: 1.01 (ref 1.005–1.030)
pH: 7 (ref 5.0–8.0)

## 2020-11-17 LAB — TSH: TSH: 2.168 u[IU]/mL (ref 0.350–4.500)

## 2020-11-17 LAB — ETHANOL: Alcohol, Ethyl (B): <10 mg/dL (ref <10)

## 2020-11-17 MED ORDER — HYDROXYZINE HCL 25 MG PO TABS: 25.0000 mg | ORAL_TABLET | Freq: Every evening | ORAL | 0 refills | Status: AC | PRN

## 2020-11-17 MED ORDER — SODIUM CHLORIDE 0.9 % IV BOLUS
1000.0000 mL | Freq: Once | INTRAVENOUS | Status: AC
  Administered 2020-11-17: 1000 mL via INTRAVENOUS

## 2020-11-17 NOTE — Discharge Instructions (Addendum)
Your sodium was low today.  Please see your doctor in the next week to recheck you sodium.

## 2020-11-17 NOTE — ED Triage Notes (Signed)
Pt reports anxiety for several weeks with some difficulty walking, poor appetite. Wife is worried that something medical is getting checked out. Alert and oriented x 4.

## 2020-11-17 NOTE — ED Notes (Signed)
TTS consult currently in process.

## 2020-11-17 NOTE — ED Notes (Signed)
Psych spoke to pt with plan to call pt back after talking to psychiatrist. Pending dispo.

## 2020-11-17 NOTE — ED Provider Notes (Signed)
[facility] EMERGENCY DEPARTMENT Provider Note   CSN: [id number] Arrival date & time: 11/17/20  1051     History Chief Complaint  Patient presents with  . Anxiety    Bradley Larsen is a 70 y.o. male.  The history is provided by the patient, the spouse and medical records.  Anxiety   Bradley Larsen is a 70 y.o. male who presents to the Emergency Department complaining of anxiety and depression. He presents the emergency department accompanied by his wife for evaluation of progressive anxiety and depressive symptoms. Over the last three weeks he has been experiencing new onset of anxiety and feeling depressed. He has poor appetite, poor sleep. Wife states that his gait is shuffling and he is very slow and cannot keep up with her anymore. He denies any pain, nausea, vomiting, diarrhea, dysuria. He states that he just does not have the desire to eat. He denies any SI. Wife is concerned that there is a medical cause for this new onset of depression. He is a former smoker. He does drink alcohol, a couple beers several days a week. Denies street drugs.    Past Medical History:  Diagnosis Date  . Acid reflux   . Diabetes mellitus without complication ([place])   . Distal radius fracture, right   . High cholesterol   . Hypertension     There are no problems to display for this patient.   Past Surgical History:  Procedure Laterality Date  . HERNIA REPAIR    . KNEE ARTHROSCOPY Left   . OPEN REDUCTION INTERNAL FIXATION (ORIF) DISTAL RADIAL FRACTURE Right 07/22/2017   Procedure: OPEN REDUCTION INTERNAL FIXATION (ORIF) RIGHT DISTAL RADIAL FRACTURE;  Surgeon: Leanora Cover, MD;  Location: [facility];  Service: Orthopedics;  Laterality: Right;       History reviewed. No pertinent family history.  Social History   Tobacco Use  . Smoking status: Former Research scientist (life sciences)  . Smokeless tobacco: Never Used  Vaping Use  . Vaping Use: Never used  Substance Use Topics  .  Alcohol use: Yes    Comment: social  . Drug use: No    Home Medications Prior to Admission medications   Medication Sig Start Date End Date Taking? Authorizing Provider  hydrOXYzine (ATARAX/VISTARIL) 25 MG tablet Take 1 tablet (25 mg total) by mouth at bedtime as needed. 11/17/20  Yes Quintella Reichert, MD  atorvastatin (LIPITOR) 40 MG tablet Take 40 mg by mouth daily. 07/06/17   [provider]  gabapentin (NEURONTIN) 300 MG capsule Take 300 mg by mouth 3 (three) times daily. 07/06/17   [provider]  ibuprofen (ADVIL,MOTRIN) 200 MG tablet Take 400-600 mg by mouth every 6 (six) hours as needed for headache, mild pain or moderate pain.    [provider]  lansoprazole (PREVACID) 30 MG capsule Take 30 mg by mouth daily. 07/08/17   [provider]  lisinopril-hydrochlorothiazide (PRINZIDE,ZESTORETIC) 20-25 MG tablet Take 1 tablet by mouth daily. 07/06/17   [provider]  metFORMIN (GLUCOPHAGE) 500 MG tablet Take 500 mg by mouth 2 (two) times daily. 07/06/17   [provider]  oxyCODONE-acetaminophen (PERCOCET) 5-325 MG tablet 1-2 tabs PO q6 hours prn pain 07/22/17   Leanora Cover, MD    Allergies    Patient has no known allergies.  Review of Systems   Review of Systems  All other systems reviewed and are negative.   Physical Exam Updated Vital Signs BP 128/76 (BP Location: Right Arm)   Pulse  63   Temp 98 F (36.7 C) (Oral)   Resp 15   SpO2 100%   Physical Exam Vitals and nursing note reviewed.  Constitutional:      Appearance: He is well-developed.  HENT:     Head: Normocephalic and atraumatic.  Cardiovascular:     Rate and Rhythm: Normal rate and regular rhythm.  Pulmonary:     Effort: Pulmonary effort is normal. No respiratory distress.  Abdominal:     Palpations: Abdomen is soft.     Tenderness: There is no abdominal tenderness. There is no guarding or rebound.  Musculoskeletal:        General: No swelling or  tenderness.     Comments: 2+ DP pulses bilaterally  Skin:    General: Skin is warm and dry.  Neurological:     Mental Status: He is alert and oriented to person, place, and time.     Comments: No asymmetry of facial movements. Visual fields grossly intact. Five out of five strength in all four extremities with sensational light touch intact in all four extremities  Psychiatric:        Behavior: Behavior normal.     ED Results / Procedures / Treatments   Labs (all labs ordered are listed, but only abnormal results are displayed) Labs Reviewed  COMPREHENSIVE METABOLIC PANEL - Abnormal; Notable for the following components:      Result Value   Sodium 126 (*)    Chloride 94 (*)    Glucose, Bld 123 (*)    All other components within normal limits  CBC WITH DIFFERENTIAL/PLATELET  URINALYSIS, ROUTINE W REFLEX MICROSCOPIC  ETHANOL  TSH    EKG None ED ECG REPORT   Date: 11/17/2020  Rate: 79  Rhythm: normal sinus rhythm  QRS Axis: left  Intervals: normal  ST/T Wave abnormalities: normal  Conduction Disutrbances:none  Narrative Interpretation:   Old EKG Reviewed: unchanged  I have personally reviewed the EKG tracing and agree with the computerized printout as noted.  Radiology CT Head Wo Contrast  Result Date: 11/17/2020 CLINICAL DATA:  Altered mental status. EXAM: CT HEAD WITHOUT CONTRAST TECHNIQUE: Contiguous axial images were obtained from the base of the skull through the vertex without intravenous contrast. COMPARISON:  None. FINDINGS: Brain: Mild generalized age related parenchymal volume loss with commensurate dilatation of the ventricles and sulci. Minimal chronic small vessel ischemic change within the periventricular white matter. No mass, hemorrhage, edema or other evidence of acute parenchymal abnormality. No extra-axial hemorrhage. Vascular: No hyperdense vessel or unexpected calcification. Skull: Normal. Negative for fracture or focal lesion. Sinuses/Orbits: No acute  finding. Other: None. IMPRESSION: 1. No acute findings. No intracranial mass, hemorrhage or edema. 2. Mild chronic small vessel ischemic change in the white matter. Electronically Signed   By: Franki Cabot M.D.   On: 11/17/2020 12:13    Procedures Procedures   Medications Ordered in ED Medications  sodium chloride 0.9 % bolus 1,000 mL (0 mLs Intravenous Stopped 11/17/20 1331)    ED Course  I have reviewed the triage vital signs and the nursing notes.  Pertinent labs & imaging results that were available during my care of the patient were reviewed by me and considered in my medical decision making (see chart for details).    MDM Rules/Calculators/A&P                          Patient here for evaluation of progressive anxiety and depressive symptoms.  Labs  with mild hyponatremia, c/w poor oral intake - treated with IVF.  He has been medically cleared for psychiatric evaluation and treatment.    He has been evaluated by psychiatry and medically cleared. Plan to discharge home with outpatient resources as well as return precautions.    Final Clinical Impression(s) / ED Diagnoses Final diagnoses:  Depression, unspecified depression type  Hyponatremia    Rx / DC Orders ED Discharge Orders         Ordered    hydrOXYzine (ATARAX/VISTARIL) 25 MG tablet  At bedtime PRN        11/17/20 1448           Quintella Reichert, MD 11/17/20 1453

## 2020-11-17 NOTE — BH Assessment (Signed)
Comprehensive Clinical Assessment (CCA) Note  11/17/2020 Belinda Bringhurst [id number]  Disposition: Per Merlyn Lot, NP, patient has ben psych cleared to follow-up with [facility]  The patient demonstrates the following risk factors for suicide: Chronic risk factors for suicide include: patient has a long history of depresion and substance use issues Acute risk factors for suicide include: Patient states that he is drinking daily, he is depressed, he is not sleeping or eating and he is in a high risk category for suicide risk. Protective factors for this patient include: Patient is financially stable, has a loving and supportive wife, he has no suicidal ideation. Considering these factors, the overall suicide risk at this point appears to be low. Patient is appropriate for outpatient follow up.  PHQ2-9   Flowsheet Row ED from 11/17/2020 in [facility]  PHQ-2 Total Score 4  PHQ-9 Total Score 14    Flowsheet Row ED from 11/17/2020 in [facility] No Risk     Patient presents to the [facility] ED seeking help for his anxiety and depression.  Patient states that he has experienced bouts of depression in the past and has been on medications for his depression.  However, he states that medications never really helped in the past.  He states that his depression would last for a few weeks, but generally subsided. He states that this time has been harder for him because it has affected his sleep and his appetite.  He states that his PCP prescribed him Celexa and Aprazolam, but he states that the medications have not helped.  Patient denies any SI/HI/Psychosis and states that he has no history of ever trying to hurt himself or others.  Patient does report that he drinks three beers on average, daily. Patient states that he last drank yesterday.  Patient denies any history of abuse or  self-mutilation.  Patient is alert and oriented.  His mood is depressed.  His thoughts are organized and his memory intact.  His judgment, insight and impulse control are mildly impaired.  He does not appear to be responding to any internal stimuli.   Chief Complaint:  Chief Complaint  Patient presents with  . Anxiety   Visit Diagnosis: F33.2 Major Depressive Disorder Severe   CCA Screening, Triage and Referral (STR)  Patient Reported Information How did you hear about [place]? Self  Referral name: No data recorded Referral phone number: No data recorded  Whom do you see for routine medical problems? Primary Care  Practice/Facility Name: Shirline Frees, MD  Practice/Facility Phone Number: No data recorded Name of Contact: No data recorded Contact Number: No data recorded Contact Fax Number: No data recorded Prescriber Name: No data recorded Prescriber Address (if known): No data recorded  What Is the Reason for Your Visit/Call Today? Patient is seeking help for his depression and his anxiety  How Long Has This Been Causing You Problems? 1-6 months  What Do You Feel Would Help You the Most Today? Treatment for Depression or other mood problem   Have You Recently Been in Any Inpatient Treatment (Hospital/Detox/Crisis Center/28-Day Program)? No  Name/Location of Program/Hospital:No data recorded How Long Were You There? No data recorded When Were You Discharged? No data recorded  Have You Ever Received Services From [facility] Before? No  Who Do You See at [facility]? No data recorded  Have You Recently Had Any Thoughts About Hurting Yourself? No  Are You Planning  to Commit Suicide/Harm Yourself At This time? No   Have you Recently Had Thoughts About [place]? No  Explanation: No data recorded  Have You Used Any Alcohol or Drugs in the Past 24 Hours? Yes  How Long Ago Did You Use Drugs or Alcohol? No data recorded What Did You Use and How Much?  patient states that he drinks three beers daily and his last use of alcohol was yesterday   Do You Currently Have a Therapist/Psychiatrist? No  Name of Therapist/Psychiatrist: No data recorded  Have You Been Recently Discharged From Any Office Practice or Programs? No  Explanation of Discharge From Practice/Program: No data recorded    CCA Screening Triage Referral Assessment Type of Contact: Tele-Assessment  Is this Initial or Reassessment? Initial Assessment  Date Telepsych consult ordered in [facility]:  11/17/2020  Time Telepsych consult ordered in [facility]:  1232   Patient Reported Information Reviewed? Yes  Patient Left Without Being Seen? No data recorded Reason for Not Completing Assessment: No data recorded  Collateral Involvement: wife was present during the assessment   Does Patient Have a [place]? No data recorded Name and Contact of Legal Guardian: No data recorded If Minor and Not Living with Parent(s), Who has Custody? No data recorded Is CPS involved or ever been involved? Never  Is APS involved or ever been involved? Never   Patient Determined To Be At Risk for Harm To Self or Others Based on Review of Patient Reported Information or Presenting Complaint? No  Method: No data recorded Availability of Means: No data recorded Intent: No data recorded Notification Required: No data recorded Additional Information for Danger to Others Potential: No data recorded Additional Comments for Danger to Others Potential: No data recorded Are There Guns or Other Weapons in Your Home? No data recorded Types of Guns/Weapons: No data recorded Are These Weapons Safely Secured?                            No data recorded Who Could Verify You Are Able To Have These Secured: No data recorded Do You Have any Outstanding Charges, Pending Court Dates, Parole/Probation? No data recorded Contacted To Inform of Risk of Harm To Self or Others: No data  recorded  Location of Assessment: No data recorded  Does Patient Present under Involuntary Commitment? No  IVC Papers Initial File Date: No data recorded  [place] of Residence: [place]   Patient Currently Receiving the Following Services: Not Receiving Services   Determination of Need: Routine (7 days)   Options For Referral: Partial Hospitalization; Outpatient Therapy; Intensive Outpatient Therapy     CCA Biopsychosocial Intake/Chief Complaint:  Patient presents to the [facility] ED seeking help for his anxiety and depression.  Patient states that he has experienced bouts of depression in the past and has been on medications for his depression.  However, he states that medications never really helped in the past.  He states that his depression would last for a few weeks, but generally subsided. He states that this time has been harder for him because it has affected his sleep and his appetite.  He states that his PCP prescribed him Celexa and Aprazolam, but he states that the medications have not helped.  Patient denies any SI/HI/Psychosis and states that he has no history of ever trying to hurt himself or others.  Patient does report that he drinks three beers on average, daily. Patient states  that he last drank yesterday.  Patient denies any history of abuse or self-mutilation.  Current Symptoms/Problems: patient has a depressed mood and sleep disturbance   Patient Reported Schizophrenia/Schizoaffective Diagnosis in Past: No   Strengths: not assessed  Preferences: patient has no special needs that require accommodation  Abilities: not assessed   Type of Services Patient Feels are Needed: otpatient medication management   Initial Clinical Notes/Concerns: No data recorded  Mental Health Symptoms Depression:  Change in energy/activity; Increase/decrease in appetite; Sleep (too much or little)   Duration of Depressive symptoms: Greater than two weeks   Mania:  None    Anxiety:   None   Psychosis:  None   Duration of Psychotic symptoms: No data recorded  Trauma:  None   Obsessions:  None   Compulsions:  None   Inattention:  None   Hyperactivity/Impulsivity:  N/A   Oppositional/Defiant Behaviors:  None   Emotional Irregularity:  None   Other Mood/Personality Symptoms:  No data recorded   Mental Status Exam Appearance and self-care  Stature:  Average   Weight:  Average weight   Clothing:  Casual; Neat/clean   Grooming:  Normal   Cosmetic use:  None   Posture/gait:  Normal   Motor activity:  Not Remarkable   Sensorium  Attention:  Normal   Concentration:  Normal   Orientation:  Object; Person; Place; Situation; Time   Recall/memory:  Normal   Affect and Mood  Affect:  Anxious; Depressed   Mood:  Anxious; Depressed   Relating  Eye contact:  Normal   Facial expression:  Depressed   Attitude toward examiner:  Cooperative   Thought and Language  Speech flow: Clear and Coherent   Thought content:  Appropriate to Mood and Circumstances   Preoccupation:  None   Hallucinations:  None   Organization:  No data recorded  [employer] of Knowledge:  Good   Intelligence:  Above Average   Abstraction:  Functional   Judgement:  No data recorded  Reality Testing:  Realistic   Insight:  Lacking   Decision Making:  Normal   Social Functioning  Social Maturity:  Responsible   Social Judgement:  Normal   Stress  Stressors:  -- (denies any current stressors)   Coping Ability:  Exhausted   Skill Deficits:  Activities of daily living   Supports:  Family     Religion: Religion/Spirituality Are You A Religious Person?:  (not assessed)  Leisure/Recreation: Leisure / Recreation Do You Have Hobbies?:  (not assessed)  Exercise/Diet: Exercise/Diet Do You Exercise?: Yes What Type of Exercise Do You Do?: Run/Walk How Many Times a Week Do You Exercise?: 4-5 times a week Do You Follow a  Special Diet?: No Do You Have Any Trouble Sleeping?: Yes Explanation of Sleeping Difficulties: patient states that he is not sleeping   CCA Employment/Education Employment/Work Situation: Employment / Work Copywriter, advertising Employment situation: Retired Archivist job has been impacted by current illness: No What is the longest time patient has a held a job?: last job was 11 years Where was the patient employed at that time?: 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 Has patient ever been in the [employer]?: Yes (Describe in comment) (2 years in the [place])  Education: Education Is Patient Currently Attending School?: No Last Grade Completed: 12 Name of High School: not assessed Did Teacher, adult education From [employer]?: Yes Did You Attend College?: Yes What Type of College Degree Do you Have?: BS in [employer]  Did [place]?: No Did You Have An Individualized Education Program (IIEP): No Did You Have Any Difficulty At School?: No Patient's Education Has Been Impacted by Current Illness: No   CCA Family/Childhood History Family and Relationship History: Family history Marital status: Married Number of Years Married: 75 What types of issues is patient dealing with in the relationship?: noe reported Are you sexually active?: No What is your sexual orientation?: heterosexual Has your sexual activity been affected by drugs, alcohol, medication, or emotional stress?: none reported Does patient have children?: Yes How many children?: 1 How is patient's relationship with their children?: daughter is grown, relatively close  Childhood History:  Childhood History By whom was/is the patient raised?: Both parents Description of patient's relationship with caregiver when they were a child: patient states that he had a good relationship with his parents Patient's description of current relationship with people who raised him/her: Patient states that his parents are  deceased. How were you disciplined when you got in trouble as a child/adolescent?: patient states that he was disciplined appropriately Does patient have siblings?: Yes Number of Siblings: 3 Description of patient's current relationship with siblings: close to two brothers, sister died earlier this year Did patient suffer any verbal/emotional/physical/sexual abuse as a child?: No Did patient suffer from severe childhood neglect?: No Has patient ever been sexually abused/assaulted/raped as an adolescent or adult?: No Was the patient ever a victim of a crime or a disaster?: No Witnessed domestic violence?: No Has patient been affected by domestic violence as an adult?: No  Child/Adolescent Assessment:     CCA Substance Use Alcohol/Drug Use: Alcohol / Drug Use Pain Medications: see MAR Prescriptions: see MAR Over the Counter: see MAR History of alcohol / drug use?: Yes Negative Consequences of Use:  (denies problems associated with drinking) Withdrawal Symptoms: Other (Comment) (denies complications with withdrawal symptoms) Substance #1 Name of Substance 1: beer 1 - Age of First Use: UTA 1 - Amount (size/oz): 2-3 beers 1 - Frequency: daily 1 - Duration: on-going 1 - Last Use / Amount: last use was yesterday 1 - Method of Aquiring: purchases at stores 1- Route of Use: oral                       ASAM's:  Six Dimensions of Multidimensional Assessment  Dimension 1:  Acute Intoxication and/or Withdrawal Potential:   Dimension 1:  Description of individual's past and current experiences of substance use and withdrawal: Patient states that he has no complications with withdrawal symptoms  Dimension 2:  Biomedical Conditions and Complications:   Dimension 2:  Description of patient's biomedical conditions and  complications: Patient denies any serious medical complications  Dimension 3:  Emotional, Behavioral, or Cognitive Conditions and Complications:  Dimension 3:   Description of emotional, behavioral, or cognitive conditions and complications: Patient states that he is anxious and depressed, but states that he has been drinking to self-medicate his depression  Dimension 4:  Readiness to Change:  Dimension 4:  Description of Readiness to Change criteria: Patient states that he will stop drinking beer now that he knows that it worsens his depresion and counteracts his medications to treat his depression  Dimension 5:  Relapse, Continued use, or Continued Problem Potential:  Dimension 5:  Relapse, continued use, or continued problem potential critiera description: Patient has no coping mechanisms to prevent relapse and continues to remain at high risk for relapse  Dimension 6:  Recovery/Living Environment:  Dimension 6:  Recovery/Iiving environment criteria description: Patient states that he lives in a supportive and nurturing environment with his wife.  ASAM Severity Score: ASAM's Severity Rating Score: 7  ASAM Recommended Level of Treatment:     Substance use Disorder (SUD) Substance Use Disorder (SUD)  Checklist Symptoms of Substance Use: Continued use despite persistent or recurrent social, interpersonal problems, caused or exacerbated by use,Substance(s) often taken in larger amounts or over longer times than was intended  Recommendations for Services/Supports/Treatments: Recommendations for Services/Supports/Treatments Recommendations For Services/Supports/Treatments: CD-IOP Intensive Chemical Dependency Program  DSM5 Diagnoses: Patient Active Problem List   Diagnosis Date Noted  . Alcohol-induced mood disorder ([place])        Referrals to Alternative Service(s): Referred to Alternative Service(s):   Place:   Date:   Time:    Referred to Alternative Service(s):   Place:   Date:   Time:    Referred to Alternative Service(s):   Place:   Date:   Time:    Referred to Alternative Service(s):   Place:   Date:   Time:     Robbin Loughmiller J Brenya Taulbee, LCAS

## 2020-11-19 DIAGNOSIS — E871 Hypo-osmolality and hyponatremia: Secondary | ICD-10-CM | POA: Diagnosis not present

## 2020-11-19 DIAGNOSIS — I10 Essential (primary) hypertension: Secondary | ICD-10-CM | POA: Diagnosis not present

## 2020-11-19 DIAGNOSIS — E1142 Type 2 diabetes mellitus with diabetic polyneuropathy: Secondary | ICD-10-CM | POA: Diagnosis not present

## 2020-11-19 DIAGNOSIS — F419 Anxiety disorder, unspecified: Secondary | ICD-10-CM | POA: Diagnosis not present

## 2020-11-20 ENCOUNTER — Telehealth (HOSPITAL_COMMUNITY): Payer: Self-pay | Admitting: Licensed Clinical Social Worker

## 2020-11-22 DIAGNOSIS — F419 Anxiety disorder, unspecified: Secondary | ICD-10-CM | POA: Diagnosis not present

## 2020-11-22 DIAGNOSIS — F32A Depression, unspecified: Secondary | ICD-10-CM | POA: Diagnosis not present

## 2020-11-28 DIAGNOSIS — I10 Essential (primary) hypertension: Secondary | ICD-10-CM | POA: Diagnosis not present

## 2020-11-28 DIAGNOSIS — E7800 Pure hypercholesterolemia, unspecified: Secondary | ICD-10-CM | POA: Diagnosis not present

## 2020-11-28 DIAGNOSIS — K219 Gastro-esophageal reflux disease without esophagitis: Secondary | ICD-10-CM | POA: Diagnosis not present

## 2020-11-28 DIAGNOSIS — E1142 Type 2 diabetes mellitus with diabetic polyneuropathy: Secondary | ICD-10-CM | POA: Diagnosis not present

## 2020-11-28 DIAGNOSIS — E119 Type 2 diabetes mellitus without complications: Secondary | ICD-10-CM | POA: Diagnosis not present

## 2020-12-02 DIAGNOSIS — F419 Anxiety disorder, unspecified: Secondary | ICD-10-CM | POA: Diagnosis not present

## 2020-12-02 DIAGNOSIS — F32A Depression, unspecified: Secondary | ICD-10-CM | POA: Diagnosis not present

## 2020-12-04 DIAGNOSIS — I10 Essential (primary) hypertension: Secondary | ICD-10-CM | POA: Diagnosis not present

## 2020-12-04 DIAGNOSIS — F419 Anxiety disorder, unspecified: Secondary | ICD-10-CM | POA: Diagnosis not present

## 2020-12-04 DIAGNOSIS — F5101 Primary insomnia: Secondary | ICD-10-CM | POA: Diagnosis not present

## 2020-12-04 DIAGNOSIS — E7800 Pure hypercholesterolemia, unspecified: Secondary | ICD-10-CM | POA: Diagnosis not present

## 2020-12-04 DIAGNOSIS — K219 Gastro-esophageal reflux disease without esophagitis: Secondary | ICD-10-CM | POA: Diagnosis not present

## 2020-12-04 DIAGNOSIS — E119 Type 2 diabetes mellitus without complications: Secondary | ICD-10-CM | POA: Diagnosis not present

## 2021-01-28 DIAGNOSIS — F419 Anxiety disorder, unspecified: Secondary | ICD-10-CM | POA: Diagnosis not present

## 2021-01-28 DIAGNOSIS — E7800 Pure hypercholesterolemia, unspecified: Secondary | ICD-10-CM | POA: Diagnosis not present

## 2021-01-28 DIAGNOSIS — F5101 Primary insomnia: Secondary | ICD-10-CM | POA: Diagnosis not present

## 2021-01-28 DIAGNOSIS — I10 Essential (primary) hypertension: Secondary | ICD-10-CM | POA: Diagnosis not present

## 2021-01-28 DIAGNOSIS — E119 Type 2 diabetes mellitus without complications: Secondary | ICD-10-CM | POA: Diagnosis not present

## 2021-02-20 DIAGNOSIS — E7800 Pure hypercholesterolemia, unspecified: Secondary | ICD-10-CM | POA: Diagnosis not present

## 2021-02-20 DIAGNOSIS — E1142 Type 2 diabetes mellitus with diabetic polyneuropathy: Secondary | ICD-10-CM | POA: Diagnosis not present

## 2021-02-20 DIAGNOSIS — K219 Gastro-esophageal reflux disease without esophagitis: Secondary | ICD-10-CM | POA: Diagnosis not present

## 2021-02-20 DIAGNOSIS — E119 Type 2 diabetes mellitus without complications: Secondary | ICD-10-CM | POA: Diagnosis not present

## 2021-02-20 DIAGNOSIS — I10 Essential (primary) hypertension: Secondary | ICD-10-CM | POA: Diagnosis not present

## 2021-04-25 DIAGNOSIS — E119 Type 2 diabetes mellitus without complications: Secondary | ICD-10-CM | POA: Diagnosis not present

## 2021-04-25 DIAGNOSIS — E1142 Type 2 diabetes mellitus with diabetic polyneuropathy: Secondary | ICD-10-CM | POA: Diagnosis not present

## 2021-04-25 DIAGNOSIS — I10 Essential (primary) hypertension: Secondary | ICD-10-CM | POA: Diagnosis not present

## 2021-04-25 DIAGNOSIS — K219 Gastro-esophageal reflux disease without esophagitis: Secondary | ICD-10-CM | POA: Diagnosis not present

## 2021-04-25 DIAGNOSIS — E7800 Pure hypercholesterolemia, unspecified: Secondary | ICD-10-CM | POA: Diagnosis not present

## 2021-04-30 DIAGNOSIS — E7800 Pure hypercholesterolemia, unspecified: Secondary | ICD-10-CM | POA: Diagnosis not present

## 2021-04-30 DIAGNOSIS — Z23 Encounter for immunization: Secondary | ICD-10-CM | POA: Diagnosis not present

## 2021-04-30 DIAGNOSIS — F419 Anxiety disorder, unspecified: Secondary | ICD-10-CM | POA: Diagnosis not present

## 2021-04-30 DIAGNOSIS — I10 Essential (primary) hypertension: Secondary | ICD-10-CM | POA: Diagnosis not present

## 2021-04-30 DIAGNOSIS — E1142 Type 2 diabetes mellitus with diabetic polyneuropathy: Secondary | ICD-10-CM | POA: Diagnosis not present

## 2021-05-08 DIAGNOSIS — H353132 Nonexudative age-related macular degeneration, bilateral, intermediate dry stage: Secondary | ICD-10-CM | POA: Diagnosis not present

## 2021-05-08 DIAGNOSIS — H2513 Age-related nuclear cataract, bilateral: Secondary | ICD-10-CM | POA: Diagnosis not present

## 2021-05-08 DIAGNOSIS — H52203 Unspecified astigmatism, bilateral: Secondary | ICD-10-CM | POA: Diagnosis not present

## 2021-05-08 DIAGNOSIS — E119 Type 2 diabetes mellitus without complications: Secondary | ICD-10-CM | POA: Diagnosis not present

## 2021-06-18 DIAGNOSIS — E7800 Pure hypercholesterolemia, unspecified: Secondary | ICD-10-CM | POA: Diagnosis not present

## 2021-06-18 DIAGNOSIS — Z0000 Encounter for general adult medical examination without abnormal findings: Secondary | ICD-10-CM | POA: Diagnosis not present

## 2021-06-18 DIAGNOSIS — Z125 Encounter for screening for malignant neoplasm of prostate: Secondary | ICD-10-CM | POA: Diagnosis not present

## 2021-06-18 DIAGNOSIS — E1142 Type 2 diabetes mellitus with diabetic polyneuropathy: Secondary | ICD-10-CM | POA: Diagnosis not present

## 2021-06-18 DIAGNOSIS — I10 Essential (primary) hypertension: Secondary | ICD-10-CM | POA: Diagnosis not present

## 2021-06-18 DIAGNOSIS — K219 Gastro-esophageal reflux disease without esophagitis: Secondary | ICD-10-CM | POA: Diagnosis not present

## 2021-06-23 DIAGNOSIS — Z23 Encounter for immunization: Secondary | ICD-10-CM | POA: Diagnosis not present

## 2021-10-01 DIAGNOSIS — Z23 Encounter for immunization: Secondary | ICD-10-CM | POA: Diagnosis not present

## 2021-10-01 DIAGNOSIS — L814 Other melanin hyperpigmentation: Secondary | ICD-10-CM | POA: Diagnosis not present

## 2021-10-01 DIAGNOSIS — L578 Other skin changes due to chronic exposure to nonionizing radiation: Secondary | ICD-10-CM | POA: Diagnosis not present

## 2021-10-01 DIAGNOSIS — L570 Actinic keratosis: Secondary | ICD-10-CM | POA: Diagnosis not present

## 2021-10-01 DIAGNOSIS — D225 Melanocytic nevi of trunk: Secondary | ICD-10-CM | POA: Diagnosis not present

## 2021-10-01 DIAGNOSIS — L821 Other seborrheic keratosis: Secondary | ICD-10-CM | POA: Diagnosis not present

## 2021-10-06 DIAGNOSIS — M79621 Pain in right upper arm: Secondary | ICD-10-CM | POA: Diagnosis not present

## 2021-10-06 DIAGNOSIS — M25561 Pain in right knee: Secondary | ICD-10-CM | POA: Diagnosis not present

## 2021-10-06 DIAGNOSIS — S50311A Abrasion of right elbow, initial encounter: Secondary | ICD-10-CM | POA: Diagnosis not present

## 2021-12-17 DIAGNOSIS — M25562 Pain in left knee: Secondary | ICD-10-CM | POA: Diagnosis not present

## 2021-12-23 DIAGNOSIS — E1142 Type 2 diabetes mellitus with diabetic polyneuropathy: Secondary | ICD-10-CM | POA: Diagnosis not present

## 2021-12-23 DIAGNOSIS — I10 Essential (primary) hypertension: Secondary | ICD-10-CM | POA: Diagnosis not present

## 2021-12-23 DIAGNOSIS — M25562 Pain in left knee: Secondary | ICD-10-CM | POA: Diagnosis not present

## 2021-12-23 DIAGNOSIS — E7800 Pure hypercholesterolemia, unspecified: Secondary | ICD-10-CM | POA: Diagnosis not present

## 2021-12-23 DIAGNOSIS — K219 Gastro-esophageal reflux disease without esophagitis: Secondary | ICD-10-CM | POA: Diagnosis not present

## 2021-12-26 DIAGNOSIS — M25562 Pain in left knee: Secondary | ICD-10-CM | POA: Diagnosis not present

## 2022-01-07 DIAGNOSIS — M25562 Pain in left knee: Secondary | ICD-10-CM | POA: Diagnosis not present

## 2022-03-12 DIAGNOSIS — S83242S Other tear of medial meniscus, current injury, left knee, sequela: Secondary | ICD-10-CM | POA: Diagnosis not present

## 2022-03-12 DIAGNOSIS — M25562 Pain in left knee: Secondary | ICD-10-CM | POA: Diagnosis not present

## 2022-04-13 DIAGNOSIS — S83242A Other tear of medial meniscus, current injury, left knee, initial encounter: Secondary | ICD-10-CM | POA: Diagnosis not present

## 2022-04-13 DIAGNOSIS — S83232A Complex tear of medial meniscus, current injury, left knee, initial encounter: Secondary | ICD-10-CM | POA: Diagnosis not present

## 2022-04-13 DIAGNOSIS — M94262 Chondromalacia, left knee: Secondary | ICD-10-CM | POA: Diagnosis not present

## 2022-04-13 DIAGNOSIS — G8918 Other acute postprocedural pain: Secondary | ICD-10-CM | POA: Diagnosis not present

## 2022-04-13 DIAGNOSIS — M65862 Other synovitis and tenosynovitis, left lower leg: Secondary | ICD-10-CM | POA: Diagnosis not present

## 2022-04-20 DIAGNOSIS — M25662 Stiffness of left knee, not elsewhere classified: Secondary | ICD-10-CM | POA: Diagnosis not present

## 2022-04-20 DIAGNOSIS — M25562 Pain in left knee: Secondary | ICD-10-CM | POA: Diagnosis not present

## 2022-04-22 DIAGNOSIS — M25662 Stiffness of left knee, not elsewhere classified: Secondary | ICD-10-CM | POA: Diagnosis not present

## 2022-04-22 DIAGNOSIS — M25562 Pain in left knee: Secondary | ICD-10-CM | POA: Diagnosis not present

## 2022-04-27 DIAGNOSIS — M25562 Pain in left knee: Secondary | ICD-10-CM | POA: Diagnosis not present

## 2022-04-27 DIAGNOSIS — M25662 Stiffness of left knee, not elsewhere classified: Secondary | ICD-10-CM | POA: Diagnosis not present

## 2022-04-29 DIAGNOSIS — M25662 Stiffness of left knee, not elsewhere classified: Secondary | ICD-10-CM | POA: Diagnosis not present

## 2022-04-29 DIAGNOSIS — M25562 Pain in left knee: Secondary | ICD-10-CM | POA: Diagnosis not present

## 2022-05-04 DIAGNOSIS — M25662 Stiffness of left knee, not elsewhere classified: Secondary | ICD-10-CM | POA: Diagnosis not present

## 2022-05-04 DIAGNOSIS — M25562 Pain in left knee: Secondary | ICD-10-CM | POA: Diagnosis not present

## 2022-05-13 DIAGNOSIS — M25562 Pain in left knee: Secondary | ICD-10-CM | POA: Diagnosis not present

## 2022-05-13 DIAGNOSIS — M25662 Stiffness of left knee, not elsewhere classified: Secondary | ICD-10-CM | POA: Diagnosis not present

## 2022-05-14 DIAGNOSIS — E119 Type 2 diabetes mellitus without complications: Secondary | ICD-10-CM | POA: Diagnosis not present

## 2022-05-14 DIAGNOSIS — H25013 Cortical age-related cataract, bilateral: Secondary | ICD-10-CM | POA: Diagnosis not present

## 2022-05-14 DIAGNOSIS — H2513 Age-related nuclear cataract, bilateral: Secondary | ICD-10-CM | POA: Diagnosis not present

## 2022-05-14 DIAGNOSIS — H353132 Nonexudative age-related macular degeneration, bilateral, intermediate dry stage: Secondary | ICD-10-CM | POA: Diagnosis not present

## 2022-05-14 DIAGNOSIS — H524 Presbyopia: Secondary | ICD-10-CM | POA: Diagnosis not present

## 2022-05-18 DIAGNOSIS — M25562 Pain in left knee: Secondary | ICD-10-CM | POA: Diagnosis not present

## 2022-05-18 DIAGNOSIS — M25662 Stiffness of left knee, not elsewhere classified: Secondary | ICD-10-CM | POA: Diagnosis not present

## 2022-05-21 DIAGNOSIS — M25562 Pain in left knee: Secondary | ICD-10-CM | POA: Diagnosis not present

## 2022-05-21 DIAGNOSIS — M25662 Stiffness of left knee, not elsewhere classified: Secondary | ICD-10-CM | POA: Diagnosis not present

## 2022-05-22 IMAGING — CT CT HEAD W/O CM
3 series · 15 of 47 positions shown, 18 images · non-contrast
Comparison: None.

CLINICAL DATA: Altered mental status.

EXAM:
CT HEAD WITHOUT CONTRAST
TECHNIQUE: Contiguous axial images were obtained from the base of the skull
through the vertex without intravenous contrast.

[Series 4: head 5.0 h30s · axial · 0.45mm/px · z∈[-129,+16]mm · 9 of 35 slices shown, 12 images]
[im 3/35  brain]
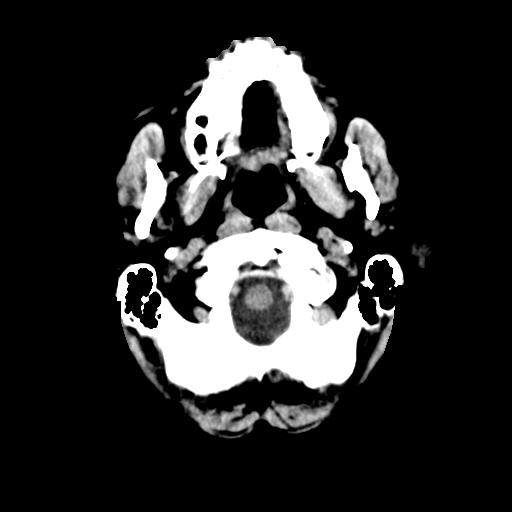
[im 3/35  bone]
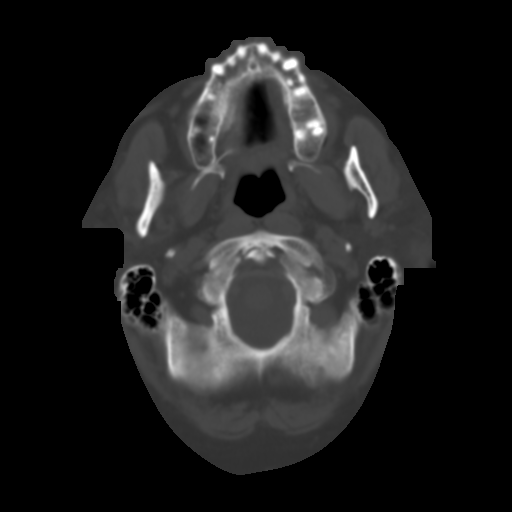
[im 6/35  brain]
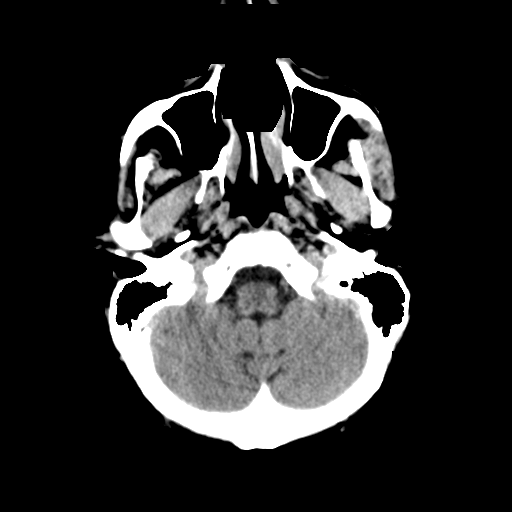
[im 10/35  brain]
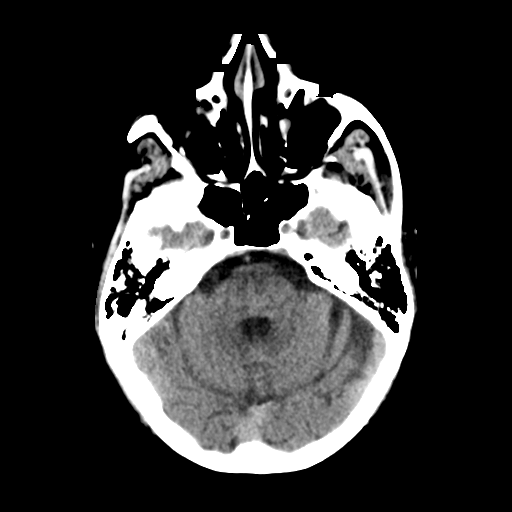
[im 13/35  brain]
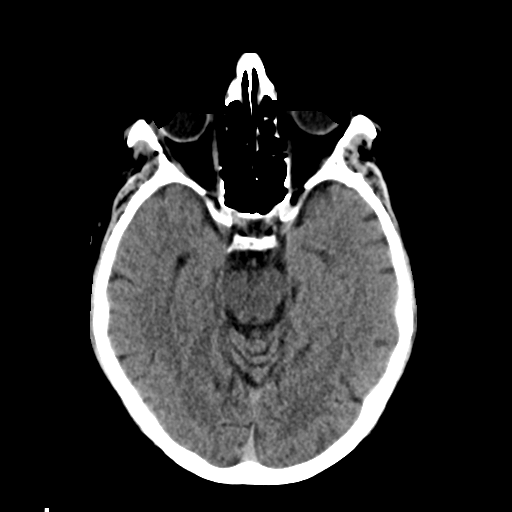
[im 18/35  brain]
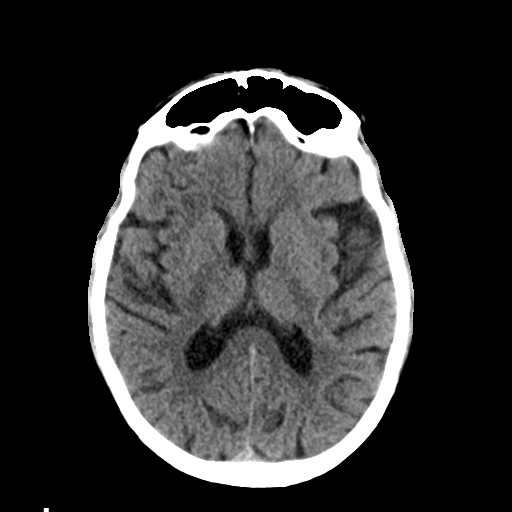
[im 18/35  bone]
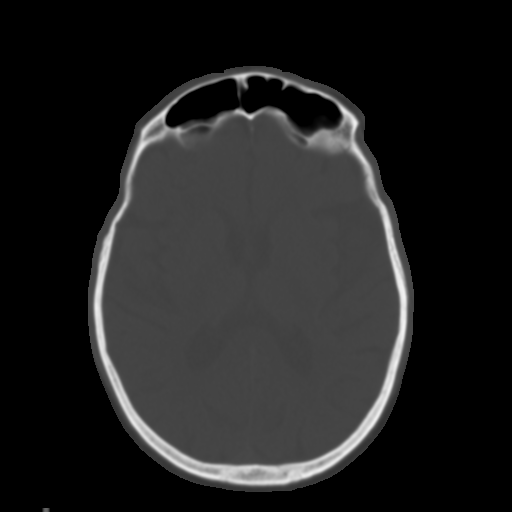
[im 22/35  brain]
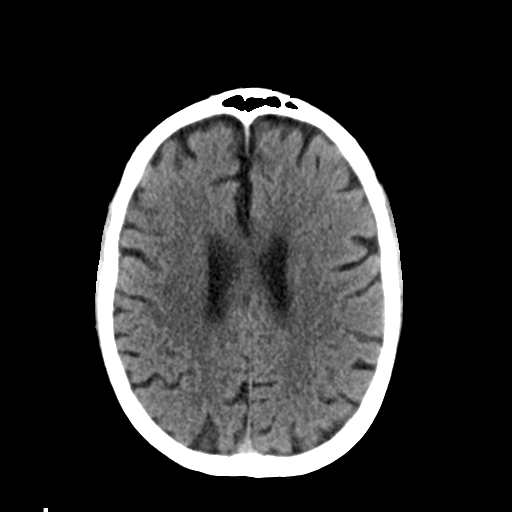
[im 25/35  brain]
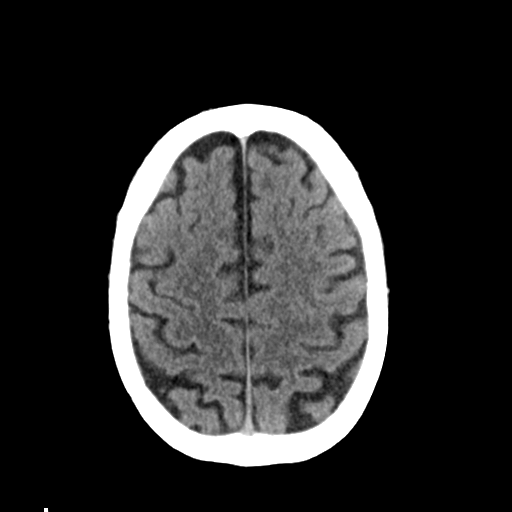
[im 29/35  brain]
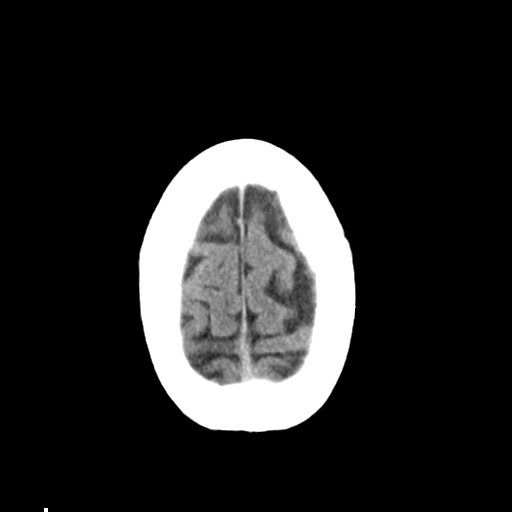
[im 32/35  brain]
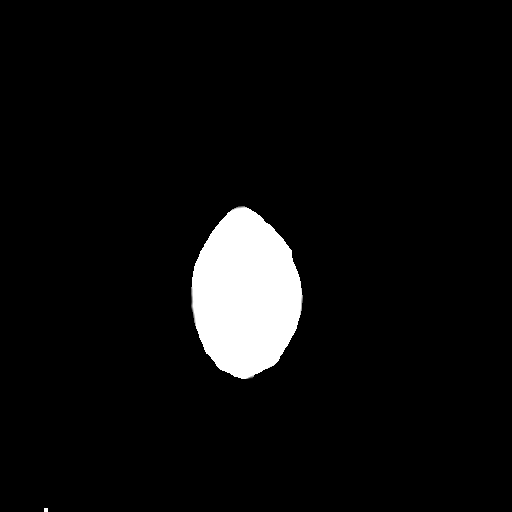
[im 32/35  bone]
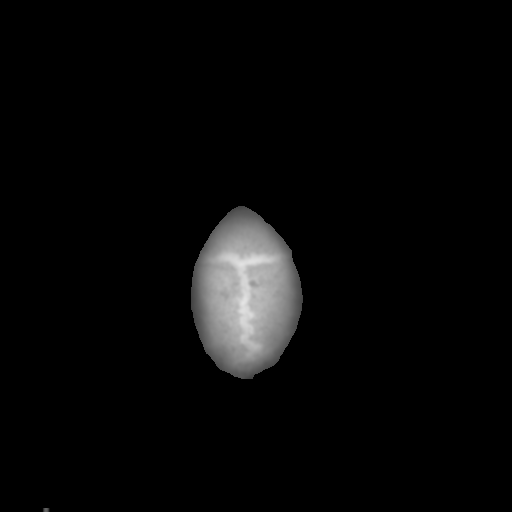

[Series 5: head 3.0 mpr cor · coronal · 0.31mm/px · 3 of 69 slices shown]
[im 24/69  brain]
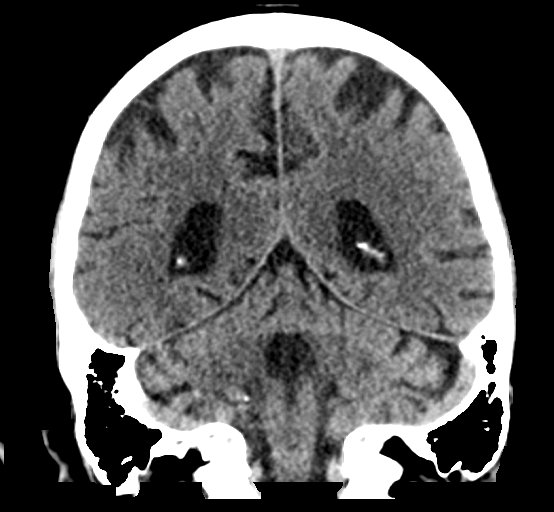
[im 31/69  brain]
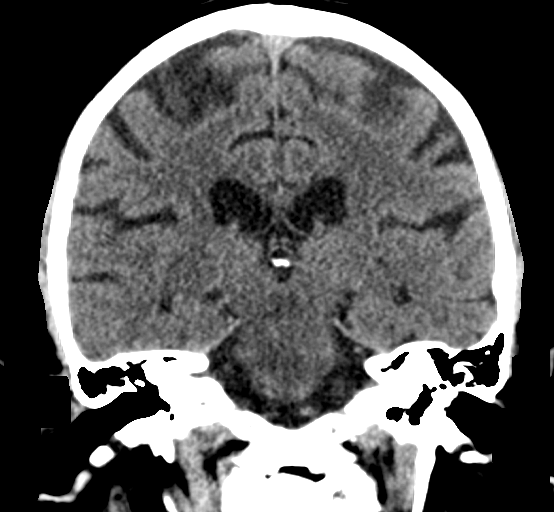
[im 38/69  brain]
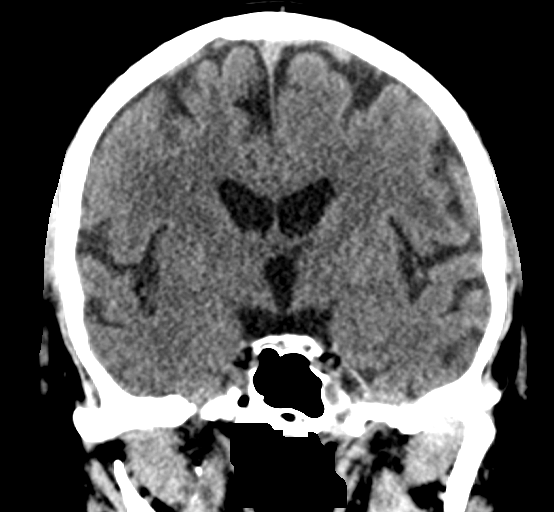

[Series 6: head 3.0 mpr sag · sagittal · 0.34mm/px · 3 of 54 slices shown]
[im 18/54  brain]
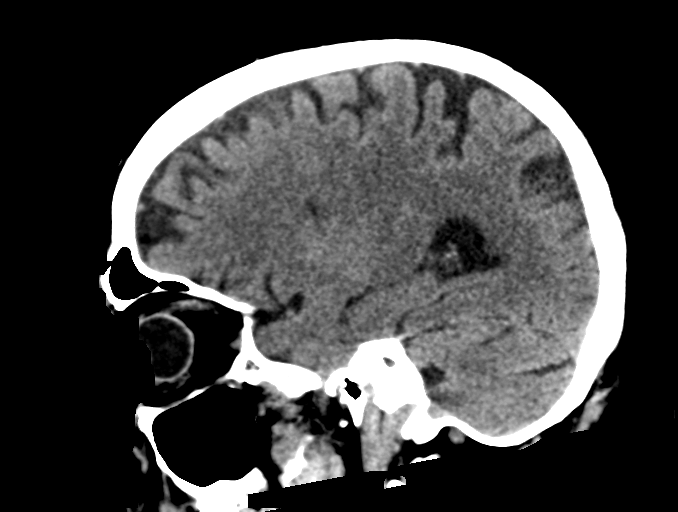
[im 27/54  brain]
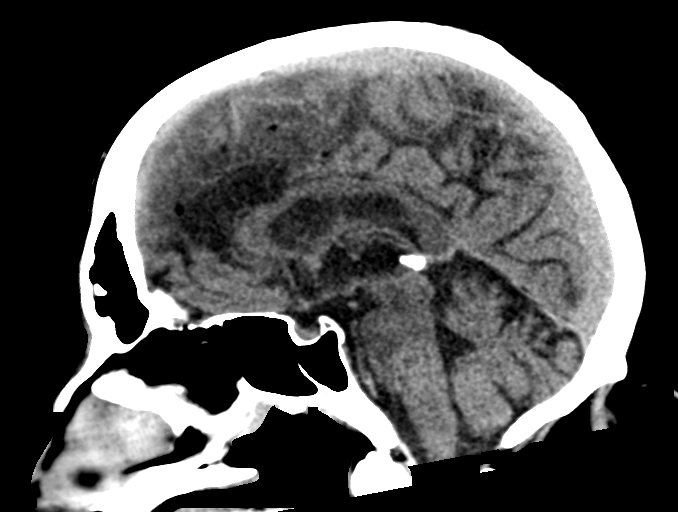
[im 36/54  brain]
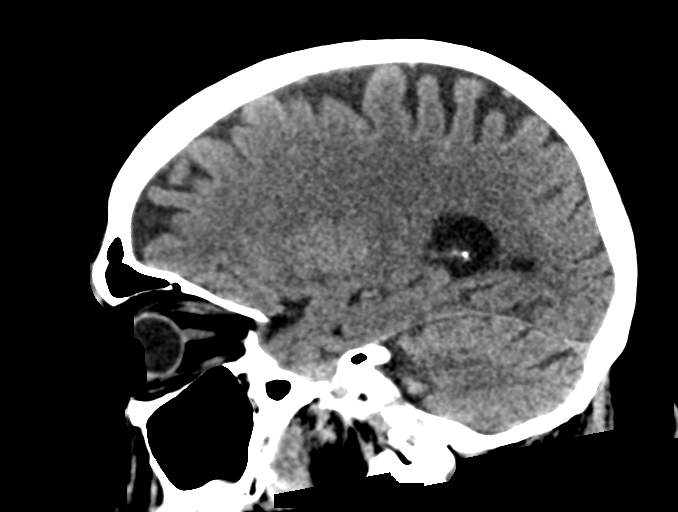

[15 of 47 positions shown; findings below may reference images not displayed]

FINDINGS: Brain: Mild generalized age related parenchymal volume loss with
commensurate dilatation of the ventricles and sulci. Minimal chronic
small vessel ischemic change within the periventricular white
matter.

No mass, hemorrhage, edema or other evidence of acute parenchymal
abnormality. No extra-axial hemorrhage.

Vascular: No hyperdense vessel or unexpected calcification.

Skull: Normal. Negative for fracture or focal lesion.

Sinuses/Orbits: No acute finding.

Other: None.
IMPRESSION: 1. No acute findings. No intracranial mass, hemorrhage or edema.
2. Mild chronic small vessel ischemic change in the white matter.

## 2022-05-25 DIAGNOSIS — M25562 Pain in left knee: Secondary | ICD-10-CM | POA: Diagnosis not present

## 2022-05-25 DIAGNOSIS — M25662 Stiffness of left knee, not elsewhere classified: Secondary | ICD-10-CM | POA: Diagnosis not present

## 2022-05-26 DIAGNOSIS — Z4789 Encounter for other orthopedic aftercare: Secondary | ICD-10-CM | POA: Diagnosis not present

## 2022-05-30 ENCOUNTER — Encounter (HOSPITAL_BASED_OUTPATIENT_CLINIC_OR_DEPARTMENT_OTHER): Payer: Self-pay | Admitting: Emergency Medicine

## 2022-05-30 ENCOUNTER — Emergency Department (HOSPITAL_BASED_OUTPATIENT_CLINIC_OR_DEPARTMENT_OTHER)
Admission: EM | Admit: 2022-05-30 | Discharge: 2022-05-30 | Disposition: A | Discharge status: Home / Self Care | Payer: Medicare Other | Attending: Emergency Medicine | Admitting: Emergency Medicine

## 2022-05-30 DIAGNOSIS — I159 Secondary hypertension, unspecified: Secondary | ICD-10-CM | POA: Insufficient documentation

## 2022-05-30 DIAGNOSIS — Z760 Encounter for issue of repeat prescription: Secondary | ICD-10-CM | POA: Insufficient documentation

## 2022-05-30 DIAGNOSIS — I10 Essential (primary) hypertension: Secondary | ICD-10-CM | POA: Diagnosis not present

## 2022-05-30 DIAGNOSIS — Z79899 Other long term (current) drug therapy: Secondary | ICD-10-CM | POA: Diagnosis not present

## 2022-05-30 MED ORDER — LISINOPRIL-HYDROCHLOROTHIAZIDE 20-25 MG PO TABS: 1.0000 | ORAL_TABLET | Freq: Every day | ORAL | 1 refills | Status: AC

## 2022-05-30 NOTE — Discharge Instructions (Signed)
Follow up with your primary care doctor.

## 2022-05-30 NOTE — ED Notes (Signed)
Pt discharged to home. Discharge instructions have been discussed with patient and/or family members. Pt verbally acknowledges understanding d/c instructions, and endorses comprehension to checkout at registration before leaving.  °

## 2022-05-30 NOTE — ED Provider Notes (Signed)
[facility] Provider Note   CSN: [id number] Arrival date & time: 05/30/22  1550     History  Chief Complaint  Patient presents with   Hypertension    Bradley Larsen is a 71 y.o. male.  Patient here with high blood pressure.  States his medications expired.  Denies any headache, chest pain, shortness of breath, weakness, numbness, chills.  Nothing makes it worse or better.  He takes lisinopril.  Denies any vision changes nausea or vomiting.  The history is provided by the patient.       Home Medications Prior to Admission medications   Medication Sig Start Date End Date Taking? Authorizing Provider  atorvastatin (LIPITOR) 40 MG tablet Take 40 mg by mouth daily. 07/06/17   [provider]  clonazePAM (KLONOPIN) 1 MG tablet 1 mg.    [provider]  gabapentin (NEURONTIN) 300 MG capsule Take 300 mg by mouth 3 (three) times daily. 07/06/17   [provider]  hydrOXYzine (ATARAX/VISTARIL) 25 MG tablet Take 1 tablet (25 mg total) by mouth at bedtime as needed. 11/17/20   Quintella Reichert, MD  ibuprofen (ADVIL,MOTRIN) 200 MG tablet Take 400-600 mg by mouth every 6 (six) hours as needed for headache, mild pain or moderate pain.    [provider]  lansoprazole (PREVACID) 30 MG capsule Take 30 mg by mouth daily. 07/08/17   [provider]  lisinopril-hydrochlorothiazide (ZESTORETIC) 20-25 MG tablet Take 1 tablet by mouth daily. 05/30/22 07/29/22  Tyteanna Ost, DO  metFORMIN (GLUCOPHAGE) 500 MG tablet Take 500 mg by mouth 2 (two) times daily. 07/06/17   [provider]  oxyCODONE-acetaminophen (PERCOCET) 5-325 MG tablet 1-2 tabs PO q6 hours prn pain 07/22/17   Leanora Cover, MD      Allergies    Patient has no known allergies.    Review of Systems   Review of Systems  Physical Exam Updated Vital Signs BP (!) 166/84   Pulse 76   Temp 97.9 F (36.6 C) (Oral)   Resp 18   Ht '5\' 11"'$  (1.803 m)   Wt  92.5 kg   SpO2 100%   BMI 28.45 kg/m  Physical Exam Vitals and nursing note reviewed.  Constitutional:      General: He is not in acute distress.    Appearance: He is well-developed. He is not ill-appearing.  HENT:     Head: Normocephalic and atraumatic.  Eyes:     Extraocular Movements: Extraocular movements intact.     Conjunctiva/sclera: Conjunctivae normal.     Pupils: Pupils are equal, round, and reactive to light.  Cardiovascular:     Rate and Rhythm: Normal rate and regular rhythm.     Pulses: Normal pulses.     Heart sounds: Normal heart sounds. No murmur heard. Pulmonary:     Effort: Pulmonary effort is normal. No respiratory distress.     Breath sounds: Normal breath sounds.  Abdominal:     Palpations: Abdomen is soft.     Tenderness: There is no abdominal tenderness.  Musculoskeletal:        General: No swelling.     Cervical back: Normal range of motion and neck supple.  Skin:    General: Skin is warm and dry.     Capillary Refill: Capillary refill takes less than 2 seconds.  Neurological:     General: No focal deficit present.     Mental Status: He is alert and oriented to person, place, and time.  Cranial Nerves: No cranial nerve deficit.     Sensory: No sensory deficit.     Motor: No weakness.     Coordination: Coordination normal.     Comments: 5+ out of 5 strength throughout, normal sensation, no drift, normal finger-nose-finger, normal speech  Psychiatric:        Mood and Affect: Mood normal.     ED Results / Procedures / Treatments   Labs (all labs ordered are listed, but only abnormal results are displayed) Labs Reviewed - No data to display  EKG EKG Interpretation  Date/Time:  Saturday May 30 2022 16:09:52 EDT Ventricular Rate:  68 PR Interval:  188 QRS Duration: 82 QT Interval:  380 QTC Calculation: 404 R Axis:   -30 Text Interpretation: Normal sinus rhythm Left axis deviation Abnormal ECG When compared with ECG of 20-Jul-2017  13:33, PREVIOUS ECG IS PRESENT Confirmed by Lennice Sites ([id number]) on 05/30/2022 4:13:06 PM  Radiology No results found.  Procedures Procedures    Medications Ordered in ED Medications - No data to display  ED Course/ Medical Decision Making/ A&P                           Medical Decision Making Risk Prescription drug management.   Bradley Larsen is here for medication refill, hypertension.  Blood pressure 166/84.  He has been taking lisinopril but used to be on a combination pill.  His lisinopril bottle states that it is expired for the last 3 months.  He is not having any chest pain or shortness of breath or stroke symptoms.  He is very well-appearing.  We will put him back on his combination lisinopril and hydrochlorothiazide pill and have him follow-up with primary care doctor.  Discharged in good condition.  Understands return precautions.  This chart was dictated using voice recognition software.  Despite best efforts to proofread,  errors can occur which can change the documentation meaning.         Final Clinical Impression(s) / ED Diagnoses Final diagnoses:  Secondary hypertension    Rx / DC Orders ED Discharge Orders          Ordered    lisinopril-hydrochlorothiazide (ZESTORETIC) 20-25 MG tablet  Daily        05/30/22 1626              [place], DO 05/30/22 1627

## 2022-05-30 NOTE — ED Triage Notes (Signed)
Pt reports BP was "185" and has been high for a few days; sts he feels fine; takes Lisinopril "for years" but noticed the bottle he has at home is expired

## 2022-07-17 DIAGNOSIS — I10 Essential (primary) hypertension: Secondary | ICD-10-CM | POA: Diagnosis not present

## 2022-07-17 DIAGNOSIS — Z125 Encounter for screening for malignant neoplasm of prostate: Secondary | ICD-10-CM | POA: Diagnosis not present

## 2022-07-17 DIAGNOSIS — Z0000 Encounter for general adult medical examination without abnormal findings: Secondary | ICD-10-CM | POA: Diagnosis not present

## 2022-07-17 DIAGNOSIS — F419 Anxiety disorder, unspecified: Secondary | ICD-10-CM | POA: Diagnosis not present

## 2022-07-17 DIAGNOSIS — E1142 Type 2 diabetes mellitus with diabetic polyneuropathy: Secondary | ICD-10-CM | POA: Diagnosis not present

## 2022-07-17 DIAGNOSIS — K219 Gastro-esophageal reflux disease without esophagitis: Secondary | ICD-10-CM | POA: Diagnosis not present

## 2022-07-17 DIAGNOSIS — E119 Type 2 diabetes mellitus without complications: Secondary | ICD-10-CM | POA: Diagnosis not present

## 2022-07-17 DIAGNOSIS — E7800 Pure hypercholesterolemia, unspecified: Secondary | ICD-10-CM | POA: Diagnosis not present

## 2022-07-17 DIAGNOSIS — Z23 Encounter for immunization: Secondary | ICD-10-CM | POA: Diagnosis not present

## 2022-08-18 DIAGNOSIS — Z4789 Encounter for other orthopedic aftercare: Secondary | ICD-10-CM | POA: Diagnosis not present

## 2022-08-26 DIAGNOSIS — H43811 Vitreous degeneration, right eye: Secondary | ICD-10-CM | POA: Diagnosis not present

## 2022-08-26 DIAGNOSIS — H2513 Age-related nuclear cataract, bilateral: Secondary | ICD-10-CM | POA: Diagnosis not present

## 2022-09-15 DIAGNOSIS — H2513 Age-related nuclear cataract, bilateral: Secondary | ICD-10-CM | POA: Diagnosis not present

## 2022-09-15 DIAGNOSIS — H43811 Vitreous degeneration, right eye: Secondary | ICD-10-CM | POA: Diagnosis not present

## 2022-09-29 DIAGNOSIS — L570 Actinic keratosis: Secondary | ICD-10-CM | POA: Diagnosis not present

## 2022-09-29 DIAGNOSIS — L578 Other skin changes due to chronic exposure to nonionizing radiation: Secondary | ICD-10-CM | POA: Diagnosis not present

## 2022-09-29 DIAGNOSIS — L820 Inflamed seborrheic keratosis: Secondary | ICD-10-CM | POA: Diagnosis not present

## 2022-09-29 DIAGNOSIS — L219 Seborrheic dermatitis, unspecified: Secondary | ICD-10-CM | POA: Diagnosis not present

## 2022-09-29 DIAGNOSIS — D225 Melanocytic nevi of trunk: Secondary | ICD-10-CM | POA: Diagnosis not present

## 2022-09-29 DIAGNOSIS — L821 Other seborrheic keratosis: Secondary | ICD-10-CM | POA: Diagnosis not present

## 2022-09-29 DIAGNOSIS — L814 Other melanin hyperpigmentation: Secondary | ICD-10-CM | POA: Diagnosis not present

## 2022-12-09 DIAGNOSIS — D045 Carcinoma in situ of skin of trunk: Secondary | ICD-10-CM | POA: Diagnosis not present

## 2022-12-09 DIAGNOSIS — L820 Inflamed seborrheic keratosis: Secondary | ICD-10-CM | POA: Diagnosis not present

## 2022-12-09 DIAGNOSIS — D485 Neoplasm of uncertain behavior of skin: Secondary | ICD-10-CM | POA: Diagnosis not present

## 2023-01-14 DIAGNOSIS — E7800 Pure hypercholesterolemia, unspecified: Secondary | ICD-10-CM | POA: Diagnosis not present

## 2023-01-14 DIAGNOSIS — F419 Anxiety disorder, unspecified: Secondary | ICD-10-CM | POA: Diagnosis not present

## 2023-01-14 DIAGNOSIS — K219 Gastro-esophageal reflux disease without esophagitis: Secondary | ICD-10-CM | POA: Diagnosis not present

## 2023-01-14 DIAGNOSIS — E1142 Type 2 diabetes mellitus with diabetic polyneuropathy: Secondary | ICD-10-CM | POA: Diagnosis not present

## 2023-01-14 DIAGNOSIS — I10 Essential (primary) hypertension: Secondary | ICD-10-CM | POA: Diagnosis not present

## 2023-01-19 DIAGNOSIS — D045 Carcinoma in situ of skin of trunk: Secondary | ICD-10-CM | POA: Diagnosis not present

## 2023-05-27 DIAGNOSIS — H353132 Nonexudative age-related macular degeneration, bilateral, intermediate dry stage: Secondary | ICD-10-CM | POA: Diagnosis not present

## 2023-05-27 DIAGNOSIS — H2513 Age-related nuclear cataract, bilateral: Secondary | ICD-10-CM | POA: Diagnosis not present

## 2023-05-27 DIAGNOSIS — H5203 Hypermetropia, bilateral: Secondary | ICD-10-CM | POA: Diagnosis not present

## 2023-05-27 DIAGNOSIS — H25013 Cortical age-related cataract, bilateral: Secondary | ICD-10-CM | POA: Diagnosis not present

## 2023-05-27 DIAGNOSIS — E119 Type 2 diabetes mellitus without complications: Secondary | ICD-10-CM | POA: Diagnosis not present

## 2023-06-23 DIAGNOSIS — Z23 Encounter for immunization: Secondary | ICD-10-CM | POA: Diagnosis not present

## 2023-08-11 DIAGNOSIS — Z0000 Encounter for general adult medical examination without abnormal findings: Secondary | ICD-10-CM | POA: Diagnosis not present

## 2023-08-11 DIAGNOSIS — F419 Anxiety disorder, unspecified: Secondary | ICD-10-CM | POA: Diagnosis not present

## 2023-08-11 DIAGNOSIS — I10 Essential (primary) hypertension: Secondary | ICD-10-CM | POA: Diagnosis not present

## 2023-08-11 DIAGNOSIS — Z125 Encounter for screening for malignant neoplasm of prostate: Secondary | ICD-10-CM | POA: Diagnosis not present

## 2023-08-11 DIAGNOSIS — E1142 Type 2 diabetes mellitus with diabetic polyneuropathy: Secondary | ICD-10-CM | POA: Diagnosis not present

## 2023-08-11 DIAGNOSIS — E7800 Pure hypercholesterolemia, unspecified: Secondary | ICD-10-CM | POA: Diagnosis not present

## 2023-08-11 DIAGNOSIS — K219 Gastro-esophageal reflux disease without esophagitis: Secondary | ICD-10-CM | POA: Diagnosis not present

## 2023-10-13 DIAGNOSIS — L821 Other seborrheic keratosis: Secondary | ICD-10-CM | POA: Diagnosis not present

## 2023-10-13 DIAGNOSIS — L814 Other melanin hyperpigmentation: Secondary | ICD-10-CM | POA: Diagnosis not present

## 2023-10-13 DIAGNOSIS — L578 Other skin changes due to chronic exposure to nonionizing radiation: Secondary | ICD-10-CM | POA: Diagnosis not present

## 2023-10-13 DIAGNOSIS — D225 Melanocytic nevi of trunk: Secondary | ICD-10-CM | POA: Diagnosis not present

## 2023-10-13 DIAGNOSIS — L570 Actinic keratosis: Secondary | ICD-10-CM | POA: Diagnosis not present

## 2023-10-13 DIAGNOSIS — Z85828 Personal history of other malignant neoplasm of skin: Secondary | ICD-10-CM | POA: Diagnosis not present

## 2023-10-13 DIAGNOSIS — R202 Paresthesia of skin: Secondary | ICD-10-CM | POA: Diagnosis not present

## 2024-01-14 DIAGNOSIS — K219 Gastro-esophageal reflux disease without esophagitis: Secondary | ICD-10-CM | POA: Diagnosis not present

## 2024-01-14 DIAGNOSIS — E7800 Pure hypercholesterolemia, unspecified: Secondary | ICD-10-CM | POA: Diagnosis not present

## 2024-01-14 DIAGNOSIS — I10 Essential (primary) hypertension: Secondary | ICD-10-CM | POA: Diagnosis not present

## 2024-01-14 DIAGNOSIS — F419 Anxiety disorder, unspecified: Secondary | ICD-10-CM | POA: Diagnosis not present

## 2024-01-14 DIAGNOSIS — E1142 Type 2 diabetes mellitus with diabetic polyneuropathy: Secondary | ICD-10-CM | POA: Diagnosis not present

## 2024-04-20 DIAGNOSIS — H903 Sensorineural hearing loss, bilateral: Secondary | ICD-10-CM | POA: Diagnosis not present

## 2024-04-24 DIAGNOSIS — Z23 Encounter for immunization: Secondary | ICD-10-CM | POA: Diagnosis not present

## 2024-06-01 DIAGNOSIS — H353132 Nonexudative age-related macular degeneration, bilateral, intermediate dry stage: Secondary | ICD-10-CM | POA: Diagnosis not present

## 2024-06-01 DIAGNOSIS — H5203 Hypermetropia, bilateral: Secondary | ICD-10-CM | POA: Diagnosis not present

## 2024-06-01 DIAGNOSIS — H25013 Cortical age-related cataract, bilateral: Secondary | ICD-10-CM | POA: Diagnosis not present

## 2024-06-01 DIAGNOSIS — E119 Type 2 diabetes mellitus without complications: Secondary | ICD-10-CM | POA: Diagnosis not present

## 2024-06-01 DIAGNOSIS — H2513 Age-related nuclear cataract, bilateral: Secondary | ICD-10-CM | POA: Diagnosis not present
